# Patient Record
Sex: Male | Born: 1960 | Race: White | Hispanic: No | Marital: Single | Smoking: Current every day smoker
Health system: Southern US, Community
[De-identification: ages and names within clinical notes are randomized; demographics above are authoritative.]

## PROBLEM LIST (undated history)

## (undated) DIAGNOSIS — M329 Systemic lupus erythematosus, unspecified: Secondary | ICD-10-CM

## (undated) DIAGNOSIS — E782 Mixed hyperlipidemia: Secondary | ICD-10-CM

## (undated) DIAGNOSIS — R9439 Abnormal result of other cardiovascular function study: Secondary | ICD-10-CM

## (undated) DIAGNOSIS — M1990 Unspecified osteoarthritis, unspecified site: Secondary | ICD-10-CM

## (undated) DIAGNOSIS — I219 Acute myocardial infarction, unspecified: Secondary | ICD-10-CM

## (undated) DIAGNOSIS — M4800 Spinal stenosis, site unspecified: Secondary | ICD-10-CM

## (undated) HISTORY — PX: ABDOMINAL SURGERY: SHX537

## (undated) HISTORY — PX: HERNIA REPAIR: SHX51

## (undated) HISTORY — PX: SPINAL CORD STIMULATOR IMPLANT: SHX2422

## (undated) HISTORY — DX: Unspecified osteoarthritis, unspecified site: M19.90

## (undated) HISTORY — DX: Mixed hyperlipidemia: E78.2

## (undated) HISTORY — DX: Abnormal result of other cardiovascular function study: R94.39

## (undated) HISTORY — DX: Acute myocardial infarction, unspecified: I21.9

## (undated) HISTORY — DX: Systemic lupus erythematosus, unspecified: M32.9

## (undated) HISTORY — PX: SHOULDER SURGERY: SHX246

## (undated) HISTORY — DX: Spinal stenosis, site unspecified: M48.00

## (undated) HISTORY — PX: WRIST SURGERY: SHX841

## (undated) HISTORY — PX: BACK SURGERY: SHX140

---

## 2015-01-17 ENCOUNTER — Ambulatory Visit (INDEPENDENT_AMBULATORY_CARE_PROVIDER_SITE_OTHER): Payer: Managed Care, Other (non HMO)

## 2015-01-17 ENCOUNTER — Encounter: Payer: Self-pay | Admitting: *Deleted

## 2015-01-17 VITALS — BP 163/78 | HR 69 | Temp 97.7°F | Resp 16

## 2015-01-17 DIAGNOSIS — Z862 Personal history of diseases of the blood and blood-forming organs and certain disorders involving the immune mechanism: Secondary | ICD-10-CM

## 2015-01-17 DIAGNOSIS — M722 Plantar fascial fibromatosis: Secondary | ICD-10-CM

## 2015-01-17 DIAGNOSIS — L949 Localized connective tissue disorder, unspecified: Secondary | ICD-10-CM

## 2015-01-17 DIAGNOSIS — R52 Pain, unspecified: Secondary | ICD-10-CM

## 2015-01-17 DIAGNOSIS — R609 Edema, unspecified: Secondary | ICD-10-CM

## 2015-01-17 DIAGNOSIS — Z872 Personal history of diseases of the skin and subcutaneous tissue: Secondary | ICD-10-CM

## 2015-01-17 DIAGNOSIS — M7751 Other enthesopathy of right foot: Secondary | ICD-10-CM

## 2015-01-17 DIAGNOSIS — S93401A Sprain of unspecified ligament of right ankle, initial encounter: Secondary | ICD-10-CM

## 2015-01-17 DIAGNOSIS — M79671 Pain in right foot: Secondary | ICD-10-CM

## 2015-01-17 MED ORDER — PREDNISONE 10 MG PO KIT: PACK | ORAL | Status: DC

## 2015-01-17 NOTE — Patient Instructions (Signed)

## 2015-01-17 NOTE — Progress Notes (Signed)
   Subjective:    Patient ID: Charles Nielsen, male    DOB: 08/24/1961, 54 y.o.   MRN: [id number]  HPI I HURT MY FOOT ABOUT 2 MONTHS AGO AND SHOVED IT UP UNDER A COUNTER AND BURNS AND THROBS AND GET NUMB AND TINGLES AND FELL TWO WEEKS BEFORE I HURT MY FOOT AND HURT MY RIGHT HIP AND SWELLING HAS GONE DOWN AND BRUISED    Review of Systems  Skin: Positive for rash.  All other systems reviewed and are negative.      Objective:   Physical Exam 54 year old white male well-developed well-nourished oriented 3 presents this time with significant pain distress affecting his right foot and ankle. Has pain ball the foot and toes with some paresthesias pins Neils lecture shock and shooting sensation is also pain tenderness on palpation with a nodular lesion mid band plantar fascial on the right foot. Having pain along the medial malleoli are and medial ankle area is well on the right foot. Does have history of injury at least 2 occasions most recently couple days ago however continues to have subsequent swelling paresthesia tingling and abnormal sensation of the forefoot rear foot and digits. Basket her status appears to be intact with pedal pulses palpable DP +2 PT plus one over 4 bilateral Refill time 3 seconds all digits. Epicritic and proprioceptive sensations intact and symmetric bilateral there is normal plantar response DTRs not listed. Dermatologically skin color pigment normal hair growth absent distally there is no open wounds no ulcers no secondary infection there is again nodularity of the medial band plantar fascia on the right there is tenderness and dorsal flexion plantar flexion a palpation around the MTP joints which otherwise are rectus x-rays reveal no fracture no cyst or tumors no signs of osseous abnormality there is pain on palpation of medial malleoli are area left foot and ankle lateral malleolus is asymptomatic pain tenderness both on dorsiflexion plantar flexion inversion of the ankle as  well as inversion eversion of the midfoot and palpation of the plantar fascia was no ulcers no secondary infections patient does have a history of systemic lupus treated previously with plaque Wynell certainly contribute to a slow healing or slow recovery from a injury likely chronic soft tissue or chronic connective tissue disorder cannot be ruled out this time.       Assessment & Plan:  Assessment right foot and ankle injury likely ankle sprain medial malleoli or sprain as well as plantar fascial tear or pull or plantar fasciitis of the right foot. Plan at this time patient is placed in air fracture walker for at least of to 4 week duration 2 weeks minimum at least 4 weeks preferably. Also this time Sterapred dose pack is initiated 12 days as instructed recommend ice to the area elevate and offload the foot. Continue monitor recheck possibly recheck x-ray in 2-4 weeks recommend 4 week follow-up at this time. Prescription for Sterapred Deas pack is given at this time 12 days maintain air fracture boot immobilization and elevation as instructed  Alvan Dame DPM

## 2015-02-14 ENCOUNTER — Ambulatory Visit (INDEPENDENT_AMBULATORY_CARE_PROVIDER_SITE_OTHER): Payer: Managed Care, Other (non HMO)

## 2015-02-14 ENCOUNTER — Encounter: Payer: Self-pay | Admitting: *Deleted

## 2015-02-14 VITALS — BP 112/63 | HR 62 | Resp 18

## 2015-02-14 DIAGNOSIS — S93401A Sprain of unspecified ligament of right ankle, initial encounter: Secondary | ICD-10-CM

## 2015-02-14 DIAGNOSIS — M722 Plantar fascial fibromatosis: Secondary | ICD-10-CM | POA: Diagnosis not present

## 2015-02-14 DIAGNOSIS — M7751 Other enthesopathy of right foot: Secondary | ICD-10-CM | POA: Diagnosis not present

## 2015-02-14 DIAGNOSIS — S93401S Sprain of unspecified ligament of right ankle, sequela: Secondary | ICD-10-CM

## 2015-02-14 DIAGNOSIS — M79671 Pain in right foot: Secondary | ICD-10-CM | POA: Diagnosis not present

## 2015-02-14 NOTE — Patient Instructions (Signed)
ICE INSTRUCTIONS  Apply ice or cold pack to the affected area at least 3 times a day for 10-15 minutes each time.  You should also use ice after prolonged activity or vigorous exercise.  Do not apply ice longer than 20 minutes at one time.  Always keep a cloth between your skin and the ice pack to prevent burns.  Being consistent and following these instructions will help control your symptoms.  We suggest you purchase a gel ice pack because they are reusable and do bit leak.  Some of them are designed to wrap around the area.  Use the method that works best for you.  Here are some other suggestions for icing.   Use a frozen bag of peas or corn-inexpensive and molds well to your body, usually stays frozen for 10 to 20 minutes.  Wet a towel with cold water and squeeze out the excess until it's damp.  Place in a bag in the freezer for 20 minutes. Then remove and use.   Recommend using alternating warm warm collar hot compress and ice pack 10 minutes of each repeat 2 or 3 times every evening. Maintain ankle stabilizer for the next 2 or 3 months to help the ankle heel and strengthening.

## 2015-02-14 NOTE — Progress Notes (Signed)
   Subjective:    Patient ID: Charles Nielsen, male    DOB: 1961/11/05, 54 y.o.   MRN: [id number]  HPI IT IS STILL SORE ON BOTH SIDE OF MY RIGHT FOOT    Review of Systems no new findings or changes     Objective:   Physical Exam Neurovascular status is intact and unchanged pedal pulses are palpable epicritic and proprioceptive sensations continues to have pain medial malleoli are area of the right ankle in likely ankle some mild instability the ankle. Medial ankle sprain did respond for the last 4 weeks with the air fracture boot for mobilization however still some tenderness on palpation inferior to the malleoli area along the deltoid ligament insertion. There is also some tenderness of the posterior tibial tendon area. 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 At this time neurovascular status intact and unchanged pedal pulses palpable does have a history of multiple injuries rotator cuff left arm surgery and ligament and tendon injuries affecting both arms.       Assessment & Plan:  Assessment resolving are healing medial ankle sprain with some residual capsulitis and posterior tibial tendinitis of the medial ankle. Patient may discontinue boot and return to a ankle stabilizer which is less obtrusive should allow to return to work activities at least part-time for the next couple weeks and then eventually full-time. Maintain ankle stabilizer maintain NSAID warm compress and ice pack as instructed recheck in a month or 2 if not resolved completely may be candidate for other noninvasive study MRI or invasive treatment such as injection.  Alvan Dame DPM

## 2015-03-14 ENCOUNTER — Ambulatory Visit: Payer: Managed Care, Other (non HMO)

## 2017-11-04 DIAGNOSIS — R079 Chest pain, unspecified: Secondary | ICD-10-CM

## 2017-11-05 DIAGNOSIS — R079 Chest pain, unspecified: Secondary | ICD-10-CM | POA: Diagnosis not present

## 2017-11-15 ENCOUNTER — Ambulatory Visit (HOSPITAL_BASED_OUTPATIENT_CLINIC_OR_DEPARTMENT_OTHER)
Admission: RE | Admit: 2017-11-15 | Discharge: 2017-11-15 | Disposition: A | Discharge status: Home / Self Care | Payer: 59 | Source: Ambulatory Visit | Attending: Cardiology | Admitting: Cardiology

## 2017-11-15 ENCOUNTER — Encounter: Payer: Self-pay | Admitting: Cardiology

## 2017-11-15 ENCOUNTER — Ambulatory Visit: Payer: 59 | Admitting: Cardiology

## 2017-11-15 DIAGNOSIS — E785 Hyperlipidemia, unspecified: Secondary | ICD-10-CM

## 2017-11-15 DIAGNOSIS — L930 Discoid lupus erythematosus: Secondary | ICD-10-CM | POA: Insufficient documentation

## 2017-11-15 DIAGNOSIS — F17200 Nicotine dependence, unspecified, uncomplicated: Secondary | ICD-10-CM | POA: Insufficient documentation

## 2017-11-15 DIAGNOSIS — I209 Angina pectoris, unspecified: Secondary | ICD-10-CM

## 2017-11-15 DIAGNOSIS — IMO0001 Reserved for inherently not codable concepts without codable children: Secondary | ICD-10-CM

## 2017-11-15 HISTORY — DX: Angina pectoris, unspecified: I20.9

## 2017-11-15 HISTORY — DX: Reserved for inherently not codable concepts without codable children: IMO0001

## 2017-11-15 HISTORY — DX: Discoid lupus erythematosus: L93.0

## 2017-11-15 HISTORY — DX: Nicotine dependence, unspecified, uncomplicated: F17.200

## 2017-11-15 HISTORY — DX: Hyperlipidemia, unspecified: E78.5

## 2017-11-15 MED ORDER — ASPIRIN EC 81 MG PO TBEC: 81.0000 mg | DELAYED_RELEASE_TABLET | Freq: Every day | ORAL | 3 refills | Status: DC

## 2017-11-15 MED ORDER — NITROGLYCERIN 0.4 MG SL SUBL: 0.4000 mg | SUBLINGUAL_TABLET | SUBLINGUAL | 6 refills | Status: DC | PRN

## 2017-11-15 MED ORDER — RANOLAZINE ER 500 MG PO TB12: 500.0000 mg | ORAL_TABLET | Freq: Two times a day (BID) | ORAL | Status: DC

## 2017-11-15 MED ORDER — RANOLAZINE ER 500 MG PO TB12
500.0000 mg | ORAL_TABLET | Freq: Two times a day (BID) | ORAL | 0 refills | Status: DC
Start: 2017-11-15 — End: 2021-06-24
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
## 2017-11-15 NOTE — Patient Instructions (Addendum)
Medication Instructions:  Your physician has recommended you make the following change in your medication: START 500 mg ranexa twice a day START 81 mg of enteric coated aspirin  Labwork: Your physician recommends that you have the following labs drawn: CBC, BMP, and PT/INR  Testing/Procedures:   [facility] CARDIOVASCULAR DIVISION [facility] [place] [address] [place] [address] Dept: [phone] Loc: [phone]  Anden Brouillette  11/15/2017  You are scheduled for a Cardiac Catheterization on Thursday, December 20 with Dr. Lance Muss.  1. Please arrive at the [facility] (Main Entrance A) at [facility]: [address] [place], [place] [address] at 5:30 AM (two hours before your procedure to ensure your preparation). Free valet parking service is available.   Special note: Every effort is made to have your procedure done on time. Please understand that emergencies sometimes delay scheduled procedures.  2. Diet: Do not eat or drink anything after midnight prior to your procedure except sips of water to take medications.  3. Labs: Done today  4. Medication instructions in preparation for your procedure:  On the morning of your procedure, take your Aspirin and any morning medicines NOT listed above.  You may use sips of water.  5. Plan for one night stay--bring personal belongings. 6. Bring a current list of your medications and current insurance cards. 7. You MUST have a responsible person to drive you home. 8. Someone MUST be with you the first 24 hours after you arrive home or your discharge will be delayed. 9. Please wear clothes that are easy to get on and off and wear slip-on shoes.  Thank you for allowing [place] to care for you!   -- [facility]   Follow-Up: Your physician recommends that you schedule a follow-up appointment in: To be determined after the heart  catheterization  Any Other Special Instructions Will Be Listed Below (If Applicable).     If you need a refill on your cardiac medications before your next appointment, please call your pharmacy.   [facility]  Garey Ham, RN, BSN   Chest X-Ray A chest X-ray is a painless test that uses radiation to create images of the structures inside of your chest. Chest X-rays are used to look for many health conditions, including heart failure, pneumonia, tuberculosis, rib fractures, breathing disorders, and cancer. They may be used to diagnose chest pain, constant coughing, or trouble breathing. Tell a health care provider about:  Any allergies you have.  All medicines you are taking, including vitamins, herbs, eye drops, creams, and over-the-counter medicines.  Any surgeries you have had.  Any medical conditions you have.  Whether you are pregnant or may be pregnant. What are the risks? Getting a chest X-ray is a safe procedure. However, you will be exposed to a small amount of radiation. Being exposed to too much radiation over a lifetime can increase the risk of cancer. This risk is small, but it may occur if you have many X-rays throughout your life. What happens before the procedure?  You may be asked to remove glasses, jewelry, and any other metal objects.  You will be asked to undress from the waist up. You may be given a hospital gown to wear.  You may be asked to wear a protective lead apron to protect parts of your body from radiation. What happens during the procedure?  You will be asked to stand still as each picture  is taken to get the best possible images.  You will be asked to take a deep breath and hold your breath for a few seconds.  The X-ray machine will create a picture of your chest using a tiny burst of radiation. This is painless.  More pictures may be taken from other angles. Typically, one picture will be taken while you face the X-ray camera, and  another picture will be taken from the side while you stand. If you cannot stand, you may be asked to lie down. The procedure may vary among health care providers and hospitals. What happens after the procedure?  The X-ray(s) will be reviewed by your health care provider or an X-ray (radiology) specialist.  It is up to you to get your test results. Ask your health care provider, or the department that is doing the test, when your results will be ready.  Your health care provider will tell you if you need more tests or a follow-up exam. Keep all follow-up visits as told by your health care provider. This is important. Summary  A chest X-ray is a safe, painless test that is used to examine the inside of the chest, heart, and lungs.  You will need to undress from the waist up and remove jewelry and metal objects before the procedure.  You will be exposed to a small amount of radiation during the procedure.  The X-ray machine will take one or more pictures of your chest while you remain as still as possible.  Later, a health care provider or specialist will review the test results with you. This information is not intended to replace advice given to you by your health care provider. Make sure you discuss any questions you have with your health care provider. Document Released: 01/11/2017 Document Revised: 01/11/2017 Document Reviewed: 01/11/2017 Elsevier Interactive Patient Education  2018 [employer].  Coronary Angiogram With Stent Coronary angiogram with stent placement is a procedure to widen or open a narrow blood vessel of the heart (coronary artery). Arteries may become blocked by cholesterol buildup (plaques) in the lining or wall. When a coronary artery becomes partially blocked, blood flow to that area decreases. This may lead to chest pain or a heart attack (myocardial infarction). A stent is a small piece of metal that looks like mesh or a spring. Stent placement may be done as  treatment for a heart attack or right after a coronary angiogram in which a blocked artery is found. Let your health care provider know about:  Any allergies you have.  All medicines you are taking, including vitamins, herbs, eye drops, creams, and over-the-counter medicines.  Any problems you or family members have had with anesthetic medicines.  Any blood disorders you have.  Any surgeries you have had.  Any medical conditions you have.  Whether you are pregnant or may be pregnant. What are the risks? Generally, this is a safe procedure. However, problems may occur, including:  Damage to the heart or its blood vessels.  A return of blockage.  Bleeding, infection, or bruising at the insertion site.  A collection of blood under the skin (hematoma) at the insertion site.  A blood clot in another part of the body.  Kidney injury.  Allergic reaction to the dye or contrast that is used.  Bleeding into the abdomen (retroperitoneal bleeding).  What happens before the procedure? Staying hydrated Follow instructions from your health care provider about hydration, which may include:  Up to 2 hours before the procedure -  you may continue to drink clear liquids, such as water, clear fruit juice, black coffee, and plain tea.  Eating and drinking restrictions Follow instructions from your health care provider about eating and drinking, which may include:  8 hours before the procedure - stop eating heavy meals or foods such as meat, fried foods, or fatty foods.  6 hours before the procedure - stop eating light meals or foods, such as toast or cereal.  2 hours before the procedure - stop drinking clear liquids.  Ask your health care provider about:  Changing or stopping your regular medicines. This is especially important if you are taking diabetes medicines or blood thinners.  Taking medicines such as ibuprofen. These medicines can thin your blood. Do not take these medicines  before your procedure if your health care provider instructs you not to. Generally, aspirin is recommended before a procedure of passing a small, thin tube (catheter) through a blood vessel and into the heart (cardiac catheterization).  What happens during the procedure?  An IV tube will be inserted into one of your veins.  You will be given one or more of the following: ? A medicine to help you relax (sedative). ? A medicine to numb the area where the catheter will be inserted into an artery (local anesthetic).  To reduce your risk of infection: ? Your health care team will wash or sanitize their hands. ? Your skin will be washed with soap. ? Hair may be removed from the area where the catheter will be inserted.  Using a guide wire, the catheter will be inserted into an artery. The location may be in your groin, in your wrist, or in the fold of your arm (near your elbow).  A type of X-ray (fluoroscopy) will be used to help guide the catheter to the opening of the arteries in the heart.  A dye will be injected into the catheter, and X-rays will be taken. The dye will help to show where any narrowing or blockages are located in the arteries.  A tiny wire will be guided to the blocked spot, and a balloon will be inflated to make the artery wider.  The stent will be expanded and will crush the plaques into the wall of the vessel. The stent will hold the area open and improve the blood flow. Most stents have a drug coating to reduce the risk of the stent narrowing over time.  The artery may be made wider using a drill, laser, or other tools to remove plaques.  When the blood flow is better, the catheter will be removed. The lining of the artery will grow over the stent, which stays where it was placed. This procedure may vary among health care providers and hospitals. What happens after the procedure?  If the procedure is done through the leg, you will be kept in bed lying flat for about 6  hours. You will be instructed to not bend and not cross your legs.  The insertion site will be checked frequently.  The pulse in your foot or wrist will be checked frequently.  You may have additional blood tests, X-rays, and a test that records the electrical activity of your heart (electrocardiogram, or ECG). This information is not intended to replace advice given to you by your health care provider. Make sure you discuss any questions you have with your health care provider. Document Released: 05/22/2003 Document Revised: 07/15/2016 Document Reviewed: 06/20/2016 Elsevier Interactive Patient Education  2017 [employer].  Smoking Tobacco Information Smoking tobacco will very likely harm your health. Tobacco contains a poisonous (toxic), colorless chemical called nicotine. Nicotine affects the brain and makes tobacco addictive. This change in your brain can make it hard to stop smoking. Tobacco also has other toxic chemicals that can hurt your body and raise your risk of many cancers. How can smoking tobacco affect me? Smoking tobacco can increase your chances of having serious health conditions, such as:  Cancer. Smoking is most commonly associated with lung cancer, but can lead to cancer in other parts of the body.  Chronic obstructive pulmonary disease (COPD). This is a long-term lung condition that makes it hard to breathe. It also gets worse over time.  High blood pressure (hypertension), heart disease, stroke, or heart attack.  Lung infections, such as pneumonia.  Cataracts. This is when the lenses in the eyes become clouded.  Digestive problems. This may include peptic ulcers, heartburn, and gastroesophageal reflux disease (GERD).  Oral health problems, such as gum disease and tooth loss.  Loss of taste and smell.  Smoking can affect your appearance by causing:  Wrinkles.  Yellow or stained teeth, fingers, and fingernails.  Smoking tobacco can also affect your social  life.  Many workplaces, [employer], hotels, and public places are tobacco-free. This means that you may experience challenges in finding places to smoke when away from home.  The cost of a smoking habit can be expensive. Expenses for someone who smokes come in two ways: ? You spend money on a regular basis to buy tobacco. ? Your health care costs in the long-term are higher if you smoke.  Tobacco smoke can also affect the health of those around you. Children of smokers have greater chances of: ? Sudden infant death syndrome (SIDS). ? Ear infections. ? Lung infections.  What lifestyle changes can be made?  Do not start smoking. Quit if you already do.  To quit smoking: ? Make a plan to quit smoking and commit yourself to it. Look for programs to help you and ask your health care provider for recommendations and ideas. ? Talk with your health care provider about using nicotine replacement medicines to help you quit. Medicine replacement medicines include gum, lozenges, patches, sprays, or pills. ? Do not replace cigarette smoking with electronic cigarettes, which are commonly called e-cigarettes. The safety of e-cigarettes is not known, and some may contain harmful chemicals. ? Avoid places, people, or situations that tempt you to smoke. ? If you try to quit but return to smoking, don't give up hope. It is very common for people to try a number of times before they fully succeed. When you feel ready again, give it another try.  Quitting smoking might affect the way you eat as well as your weight. Be prepared to monitor your eating habits. Get support in planning and following a healthy diet.  Ask your health care provider about having regular tests (screenings) to check for cancer. This may include blood tests, imaging tests, and other tests.  Exercise regularly. Consider taking walks, joining a gym, or doing yoga or exercise classes.  Develop skills to manage your stress. These skills  include meditation. What are the benefits of quitting smoking? By quitting smoking, you may:  Lower your risk of getting cancer and other diseases caused by smoking.  Live longer.  Breathe better.  Lower your blood pressure and heart rate.  Stop your addiction to tobacco.  Stop creating secondhand smoke that hurts other people.  Improve  your sense of taste and smell.  Look better over time, due to having fewer wrinkles and less staining.  What can happen if changes are not made? If you do not stop smoking, you may:  Get cancer and other diseases.  Develop COPD or other long-term (chronic) lung conditions.  Develop serious problems with your heart and blood vessels (cardiovascular system).  Need more tests to screen for problems caused by smoking.  Have higher, long-term healthcare costs from medicines or treatments related to smoking.  Continue to have worsening changes in your lungs, mouth, and nose.  Where to find support: To get support to quit smoking, consider:  Asking your health care provider for more information and resources.  Taking classes to learn more about quitting smoking.  Looking for local organizations that offer resources about quitting smoking.  Joining a support group for people who want to quit smoking in your local community.  Where to find more information: You may find more information about quitting smoking from:  HelpGuide.org: www.helpguide.org/articles/addictions/how-to-quit-smoking.htm  [employer]: smokefree.gov  American Lung Association: www.lung.org  Contact a health care provider if:  You have problems breathing.  Your lips, nose, or fingers turn blue.  You have chest pain.  You are coughing up blood.  You feel faint or you pass out.  You have other noticeable changes that cause you to worry. Summary  Smoking tobacco can negatively affect your health, the health of those around you, your finances, and your  social life.  Do not start smoking. Quit if you already do. If you need help quitting, ask your health care provider.  Think about joining a support group for people who want to quit smoking in your local community. There are many effective programs that will help you to quit this behavior. This information is not intended to replace advice given to you by your health care provider. Make sure you discuss any questions you have with your health care provider. Document Released: 11/30/2016 Document Revised: 11/30/2016 Document Reviewed: 11/30/2016 Elsevier Interactive Patient Education  2018 [employer]. Ranolazine tablets, extended release What is this medicine? RANOLAZINE (ra NOE la zeen) is a heart medicine. It is used to treat chronic chest pain (angina). This medicine must be taken regularly. It will not relieve an acute episode of chest pain. This medicine may be used for other purposes; ask your health care provider or pharmacist if you have questions. COMMON BRAND NAME(S): Ranexa What should I tell my health care provider before I take this medicine? They need to know if you have any of these conditions: -heart disease -irregular heartbeat -kidney disease -liver disease -low levels of potassium or magnesium in the blood -an unusual or allergic reaction to ranolazine, other medicines, foods, dyes, or preservatives -pregnant or trying to get pregnant -breast-feeding How should I use this medicine? Take this medicine by mouth with a glass of water. Follow the directions on the prescription label. Do not cut, crush, or chew this medicine. Take with or without food. Do not take this medication with grapefruit juice. Take your doses at regular intervals. Do not take your medicine more often then directed. Talk to your pediatrician regarding the use of this medicine in children. Special care may be needed. Overdosage: If you think you have taken too much of this medicine contact a poison  control center or emergency room at once. NOTE: This medicine is only for you. Do not share this medicine with others. What if I miss a dose? If you  miss a dose, take it as soon as you can. If it is almost time for your next dose, take only that dose. Do not take double or extra doses. What may interact with this medicine? Do not take this medicine with any of the following medications: -antivirals for HIV or AIDS -cerivastatin -certain antibiotics like chloramphenicol, clarithromycin, dalfopristin; quinupristin, isoniazid, rifabutin, rifampin, rifapentine -certain medicines used for cancer like imatinib, nilotinib -certain medicines for fungal infections like fluconazole, itraconazole, ketoconazole, posaconazole, voriconazole -certain medicines for irregular heart beat like dofetilide, dronedarone -certain medicines for seizures like carbamazepine, fosphenytoin, oxcarbazepine, phenobarbital, phenytoin -cisapride -conivaptan -cyclosporine -grapefruit or grapefruit juice -lumacaftor; ivacaftor -nefazodone -pimozide -quinacrine -St John's wort -thioridazine -ziprasidone This medicine may also interact with the following medications: -alfuzosin -certain medicines for depression, anxiety, or psychotic disturbances like bupropion, citalopram, fluoxetine, fluphenazine, paroxetine, perphenazine, risperidone, sertraline, trifluoperazine -certain medicines for cholesterol like atorvastatin, lovastatin, simvastatin -certain medicines for stomach problems like octreotide, palonosetron, prochlorperazine -eplerenone -ergot alkaloids like dihydroergotamine, ergonovine, ergotamine, methylergonovine -metformin -nicardipine -other medicines that prolong the QT interval (cause an abnormal heart rhythm) -sirolimus -tacrolimus This list may not describe all possible interactions. Give your health care provider a list of all the medicines, herbs, non-prescription drugs, or dietary supplements you use.  Also tell them if you smoke, drink alcohol, or use illegal drugs. Some items may interact with your medicine. What should I watch for while using this medicine? Visit your doctor for regular check ups. Tell your doctor or healthcare professional if your symptoms do not start to get better or if they get worse. This medicine will not relieve an acute attack of angina or chest pain. This medicine can change your heart rhythm. Your health care provider may check your heart rhythm by ordering an electrocardiogram (ECG) while you are taking this medicine. You may get drowsy or dizzy. Do not drive, use machinery, or do anything that needs mental alertness until you know how this medicine affects you. Do not stand or sit up quickly, especially if you are an older patient. This reduces the risk of dizzy or fainting spells. Alcohol may interfere with the effect of this medicine. Avoid alcoholic drinks. If you are scheduled for any medical or dental procedure, tell your healthcare provider that you are taking this medicine. This medicine can interact with other medicines used during surgery. What side effects may I notice from receiving this medicine? Side effects that you should report to your doctor or health care professional as soon as possible: -allergic reactions like skin rash, itching or hives, swelling of the face, lips, or tongue -breathing problems -changes in vision -fast, irregular or pounding heartbeat -feeling faint or lightheaded, falls -low or high blood pressure -numbness or tingling feelings -ringing in the ears -tremor or shakiness -slow heartbeat (fewer than 50 beats per minute) -swelling of the legs or feet Side effects that usually do not require medical attention (report to your doctor or health care professional if they continue or are bothersome): -constipation -drowsy -dry mouth -headache -nausea or vomiting -stomach upset This list may not describe all possible side  effects. Call your doctor for medical advice about side effects. You may report side effects to FDA at 1-800-FDA-1088. Where should I keep my medicine? Keep out of the reach of children. Store at room temperature between 15 and 30 degrees C (59 and 86 degrees F). Throw away any unused medicine after the expiration date. NOTE: This sheet is a summary. It may not cover all possible information.  If you have questions about this medicine, talk to your doctor, pharmacist, or health care provider.  2018 Elsevier/Gold Standard (2015-12-18 12:24:15)

## 2017-11-16 ENCOUNTER — Telehealth: Payer: Self-pay

## 2017-11-16 LAB — CBC WITH DIFFERENTIAL/PLATELET
BASOS: 0 %
Basophils Absolute: 0 10*3/uL (ref 0.0–0.2)
EOS (ABSOLUTE): 0.3 10*3/uL (ref 0.0–0.4)
Eos: 3 %
HEMOGLOBIN: 14.8 g/dL (ref 13.0–17.7)
Hematocrit: 42.6 % (ref 37.5–51.0)
IMMATURE GRANS (ABS): 0 10*3/uL (ref 0.0–0.1)
Immature Granulocytes: 1 %
LYMPHS: 25 %
Lymphocytes Absolute: 2.1 10*3/uL (ref 0.7–3.1)
MCH: 31 pg (ref 26.6–33.0)
MCHC: 34.7 g/dL (ref 31.5–35.7)
MCV: 89 fL (ref 79–97)
Monocytes Absolute: 0.6 10*3/uL (ref 0.1–0.9)
Monocytes: 7 %
NEUTROS ABS: 5.5 10*3/uL (ref 1.4–7.0)
Neutrophils: 64 %
PLATELETS: 243 10*3/uL (ref 150–379)
RBC: 4.77 x10E6/uL (ref 4.14–5.80)
RDW: 13.8 % (ref 12.3–15.4)
WBC: 8.5 10*3/uL (ref 3.4–10.8)

## 2017-11-16 LAB — BASIC METABOLIC PANEL
BUN / CREAT RATIO: 16 (ref 9–20)
BUN: 12 mg/dL (ref 6–24)
CO2: 24 mmol/L (ref 20–29)
CREATININE: 0.77 mg/dL (ref 0.76–1.27)
Calcium: 9.6 mg/dL (ref 8.7–10.2)
Chloride: 101 mmol/L (ref 96–106)
GFR, EST AFRICAN AMERICAN: 116 mL/min/{1.73_m2} (ref >59)
GFR, EST NON AFRICAN AMERICAN: 100 mL/min/{1.73_m2} (ref >59)
Glucose: 89 mg/dL (ref 65–99)
Potassium: 4.5 mmol/L (ref 3.5–5.2)
Sodium: 138 mmol/L (ref 134–144)

## 2017-11-16 LAB — PROTIME-INR
INR: 1 (ref 0.8–1.2)
Prothrombin Time: 10.2 s (ref 9.1–12.0)

## 2017-11-16 NOTE — Telephone Encounter (Signed)
Patient contacted pre-catheterization at [facility] scheduled for:  11/17/2017 @ 0730 Verified arrival time and place:  [facility] @ 0530 Confirmed AM meds to be taken pre-cath with sip of water: Take ASA Confirmed patient has responsible person to drive home post procedure and observe patient for 24 hours:  yes Addl concerns:  none

## 2017-11-17 ENCOUNTER — Ambulatory Visit (HOSPITAL_COMMUNITY)
Admission: RE | Admit: 2017-11-17 | Discharge: 2017-11-17 | Disposition: A | Discharge status: Home / Self Care | Payer: 59 | Source: Ambulatory Visit | Attending: Interventional Cardiology | Admitting: Interventional Cardiology

## 2017-11-17 ENCOUNTER — Encounter (HOSPITAL_COMMUNITY)
Admission: RE | Disposition: A | Discharge status: Home / Self Care | Payer: Self-pay | Source: Ambulatory Visit | Attending: Interventional Cardiology

## 2017-11-17 ENCOUNTER — Encounter (HOSPITAL_COMMUNITY): Payer: Self-pay | Admitting: Interventional Cardiology

## 2017-11-17 DIAGNOSIS — Z79899 Other long term (current) drug therapy: Secondary | ICD-10-CM | POA: Insufficient documentation

## 2017-11-17 DIAGNOSIS — L930 Discoid lupus erythematosus: Secondary | ICD-10-CM | POA: Insufficient documentation

## 2017-11-17 DIAGNOSIS — R001 Bradycardia, unspecified: Secondary | ICD-10-CM | POA: Diagnosis not present

## 2017-11-17 DIAGNOSIS — R0789 Other chest pain: Secondary | ICD-10-CM | POA: Insufficient documentation

## 2017-11-17 DIAGNOSIS — R9439 Abnormal result of other cardiovascular function study: Secondary | ICD-10-CM

## 2017-11-17 DIAGNOSIS — F17200 Nicotine dependence, unspecified, uncomplicated: Secondary | ICD-10-CM | POA: Insufficient documentation

## 2017-11-17 DIAGNOSIS — E785 Hyperlipidemia, unspecified: Secondary | ICD-10-CM | POA: Diagnosis not present

## 2017-11-17 DIAGNOSIS — I2510 Atherosclerotic heart disease of native coronary artery without angina pectoris: Secondary | ICD-10-CM | POA: Diagnosis not present

## 2017-11-17 HISTORY — PX: LEFT HEART CATH AND CORONARY ANGIOGRAPHY: CATH118249

## 2017-11-17 SURGERY — LEFT HEART CATH AND CORONARY ANGIOGRAPHY
Anesthesia: LOCAL

## 2017-11-17 MED ORDER — SODIUM CHLORIDE 0.9 % IV SOLN: 250.0000 mL | INTRAVENOUS | Status: DC | PRN

## 2017-11-17 MED ORDER — HEPARIN (PORCINE) IN NACL 2-0.9 UNIT/ML-% IJ SOLN
INTRAMUSCULAR | Status: AC
  Filled 2017-11-17: qty 1000

## 2017-11-17 MED ORDER — LIDOCAINE HCL (PF) 1 % IJ SOLN
INTRAMUSCULAR | Status: DC | PRN
  Administered 2017-11-17: 2 mL via SUBCUTANEOUS

## 2017-11-17 MED ORDER — IOPAMIDOL (ISOVUE-370) INJECTION 76%
INTRAVENOUS | Status: DC | PRN
  Administered 2017-11-17: 80 mL via INTRA_ARTERIAL

## 2017-11-17 MED ORDER — SODIUM CHLORIDE 0.9 % IV SOLN: INTRAVENOUS | Status: AC

## 2017-11-17 MED ORDER — SODIUM CHLORIDE 0.9% FLUSH: 3.0000 mL | INTRAVENOUS | Status: DC | PRN

## 2017-11-17 MED ORDER — ASPIRIN 81 MG PO CHEW: 81.0000 mg | CHEWABLE_TABLET | ORAL | Status: DC

## 2017-11-17 MED ORDER — SODIUM CHLORIDE 0.9 % WEIGHT BASED INFUSION: 1.0000 mL/kg/h | INTRAVENOUS | Status: DC

## 2017-11-17 MED ORDER — MIDAZOLAM HCL 2 MG/2ML IJ SOLN
INTRAMUSCULAR | Status: AC
  Filled 2017-11-17: qty 2

## 2017-11-17 MED ORDER — LIDOCAINE HCL (PF) 1 % IJ SOLN
INTRAMUSCULAR | Status: AC
  Filled 2017-11-17: qty 30

## 2017-11-17 MED ORDER — FENTANYL CITRATE (PF) 100 MCG/2ML IJ SOLN
INTRAMUSCULAR | Status: DC | PRN
  Administered 2017-11-17 (×2): 25 ug via INTRAVENOUS

## 2017-11-17 MED ORDER — SODIUM CHLORIDE 0.9% FLUSH: 3.0000 mL | Freq: Two times a day (BID) | INTRAVENOUS | Status: DC

## 2017-11-17 MED ORDER — HEPARIN SODIUM (PORCINE) 1000 UNIT/ML IJ SOLN
INTRAMUSCULAR | Status: DC | PRN
  Administered 2017-11-17: 4500 [IU] via INTRAVENOUS

## 2017-11-17 MED ORDER — SODIUM CHLORIDE 0.9 % WEIGHT BASED INFUSION
3.0000 mL/kg/h | INTRAVENOUS | Status: AC
  Administered 2017-11-17: 3 mL/kg/h via INTRAVENOUS

## 2017-11-17 MED ORDER — HEPARIN SODIUM (PORCINE) 1000 UNIT/ML IJ SOLN
INTRAMUSCULAR | Status: AC
  Filled 2017-11-17: qty 1

## 2017-11-17 MED ORDER — MIDAZOLAM HCL 2 MG/2ML IJ SOLN
INTRAMUSCULAR | Status: DC | PRN
  Administered 2017-11-17: 1 mg via INTRAVENOUS
  Administered 2017-11-17: 2 mg via INTRAVENOUS

## 2017-11-17 MED ORDER — FENTANYL CITRATE (PF) 100 MCG/2ML IJ SOLN
INTRAMUSCULAR | Status: AC
  Filled 2017-11-17: qty 2

## 2017-11-17 MED ORDER — VERAPAMIL HCL 2.5 MG/ML IV SOLN
INTRAVENOUS | Status: DC | PRN
  Administered 2017-11-17: 10 mL via INTRA_ARTERIAL

## 2017-11-17 MED ORDER — IOPAMIDOL (ISOVUE-370) INJECTION 76%
INTRAVENOUS | Status: AC
  Filled 2017-11-17: qty 100

## 2017-11-17 MED ORDER — VERAPAMIL HCL 2.5 MG/ML IV SOLN
INTRAVENOUS | Status: AC
  Filled 2017-11-17: qty 2

## 2017-11-17 MED ORDER — HEPARIN (PORCINE) IN NACL 2-0.9 UNIT/ML-% IJ SOLN
INTRAMUSCULAR | Status: AC | PRN
  Administered 2017-11-17: 1000 mL

## 2017-11-17 SURGICAL SUPPLY — 13 items
CATH INFINITI 5 FR JL3.5 (CATHETERS) ×2 IMPLANT
CATH INFINITI 5 FR MPA2 (CATHETERS) ×2 IMPLANT
CATH INFINITI JR4 5F (CATHETERS) ×2 IMPLANT
COVER PRB 48X5XTLSCP FOLD TPE (BAG) ×1 IMPLANT
COVER PROBE 5X48 (BAG) ×1
DEVICE RAD COMP TR BAND LRG (VASCULAR PRODUCTS) ×2 IMPLANT
GLIDESHEATH SLEND SS 6F .021 (SHEATH) ×2 IMPLANT
GUIDEWIRE INQWIRE 1.5J.035X260 (WIRE) ×1 IMPLANT
INQWIRE 1.5J .035X260CM (WIRE) ×2
KIT HEART LEFT (KITS) ×2 IMPLANT
PACK CARDIAC CATHETERIZATION (CUSTOM PROCEDURE TRAY) ×2 IMPLANT
TRANSDUCER W/STOPCOCK (MISCELLANEOUS) ×2 IMPLANT
TUBING CIL FLEX 10 FLL-RA (TUBING) ×2 IMPLANT

## 2017-11-17 NOTE — Discharge Instructions (Signed)

## 2017-11-17 NOTE — Interval H&P Note (Signed)
Cath Lab Visit (complete for each Cath Lab visit)  Clinical Evaluation Leading to the Procedure:   ACS: No.  Non-ACS:    Anginal Classification: CCS III  Anti-ischemic medical therapy: Minimal Therapy (1 class of medications)  Non-Invasive Test Results: Intermediate-risk stress test findings: cardiac mortality 1-3%/year  Prior CABG: No previous CABG      History and Physical Interval Note:  11/17/2017 7:26 AM  [name]  has presented today for surgery, with the diagnosis of Angina  The various methods of treatment have been discussed with the patient and family. After consideration of risks, benefits and other options for treatment, the patient has consented to  Procedure(s): LEFT HEART CATH AND CORONARY ANGIOGRAPHY (N/A) as a surgical intervention .  The patient's history has been reviewed, patient examined, no change in status, stable for surgery.  I have reviewed the patient's chart and labs.  Questions were answered to the patient's satisfaction.     Lance Muss

## 2017-12-01 ENCOUNTER — Encounter: Payer: Self-pay | Admitting: Cardiology

## 2017-12-01 ENCOUNTER — Ambulatory Visit (INDEPENDENT_AMBULATORY_CARE_PROVIDER_SITE_OTHER): Payer: 59 | Admitting: Cardiology

## 2017-12-01 VITALS — BP 120/70 | HR 72 | Ht 76.0 in | Wt 206.4 lb

## 2017-12-01 DIAGNOSIS — E785 Hyperlipidemia, unspecified: Secondary | ICD-10-CM

## 2017-12-01 DIAGNOSIS — L930 Discoid lupus erythematosus: Secondary | ICD-10-CM | POA: Diagnosis not present

## 2017-12-01 DIAGNOSIS — F17200 Nicotine dependence, unspecified, uncomplicated: Secondary | ICD-10-CM | POA: Diagnosis not present

## 2017-12-01 DIAGNOSIS — I209 Angina pectoris, unspecified: Secondary | ICD-10-CM | POA: Diagnosis not present

## 2017-12-01 NOTE — Patient Instructions (Addendum)
Medication Instructions:  Your physician has recommended you make the following change in your medication:  Stop Metoprolol and Ranexa  Labwork: Your physician recommends that you return for lab work in: 6 weeks for a lipid check at [facility]                      Testing/Procedures: None ordered  Follow-Up: Your physician recommends that you schedule a follow-up appointment in: 2-3 months with Dr. Bing Matter   Any Other Special Instructions Will Be Listed Below (If Applicable).     If you need a refill on your cardiac medications before your next appointment, please call your pharmacy.
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
## 2018-05-20 IMAGING — DX DG CHEST 2V
2 series · 2 of 2 positions shown · non-contrast
Comparison: No prior .

CLINICAL DATA: Cardiac catheterization.

EXAM:
CHEST  2 VIEW

[chest pa]
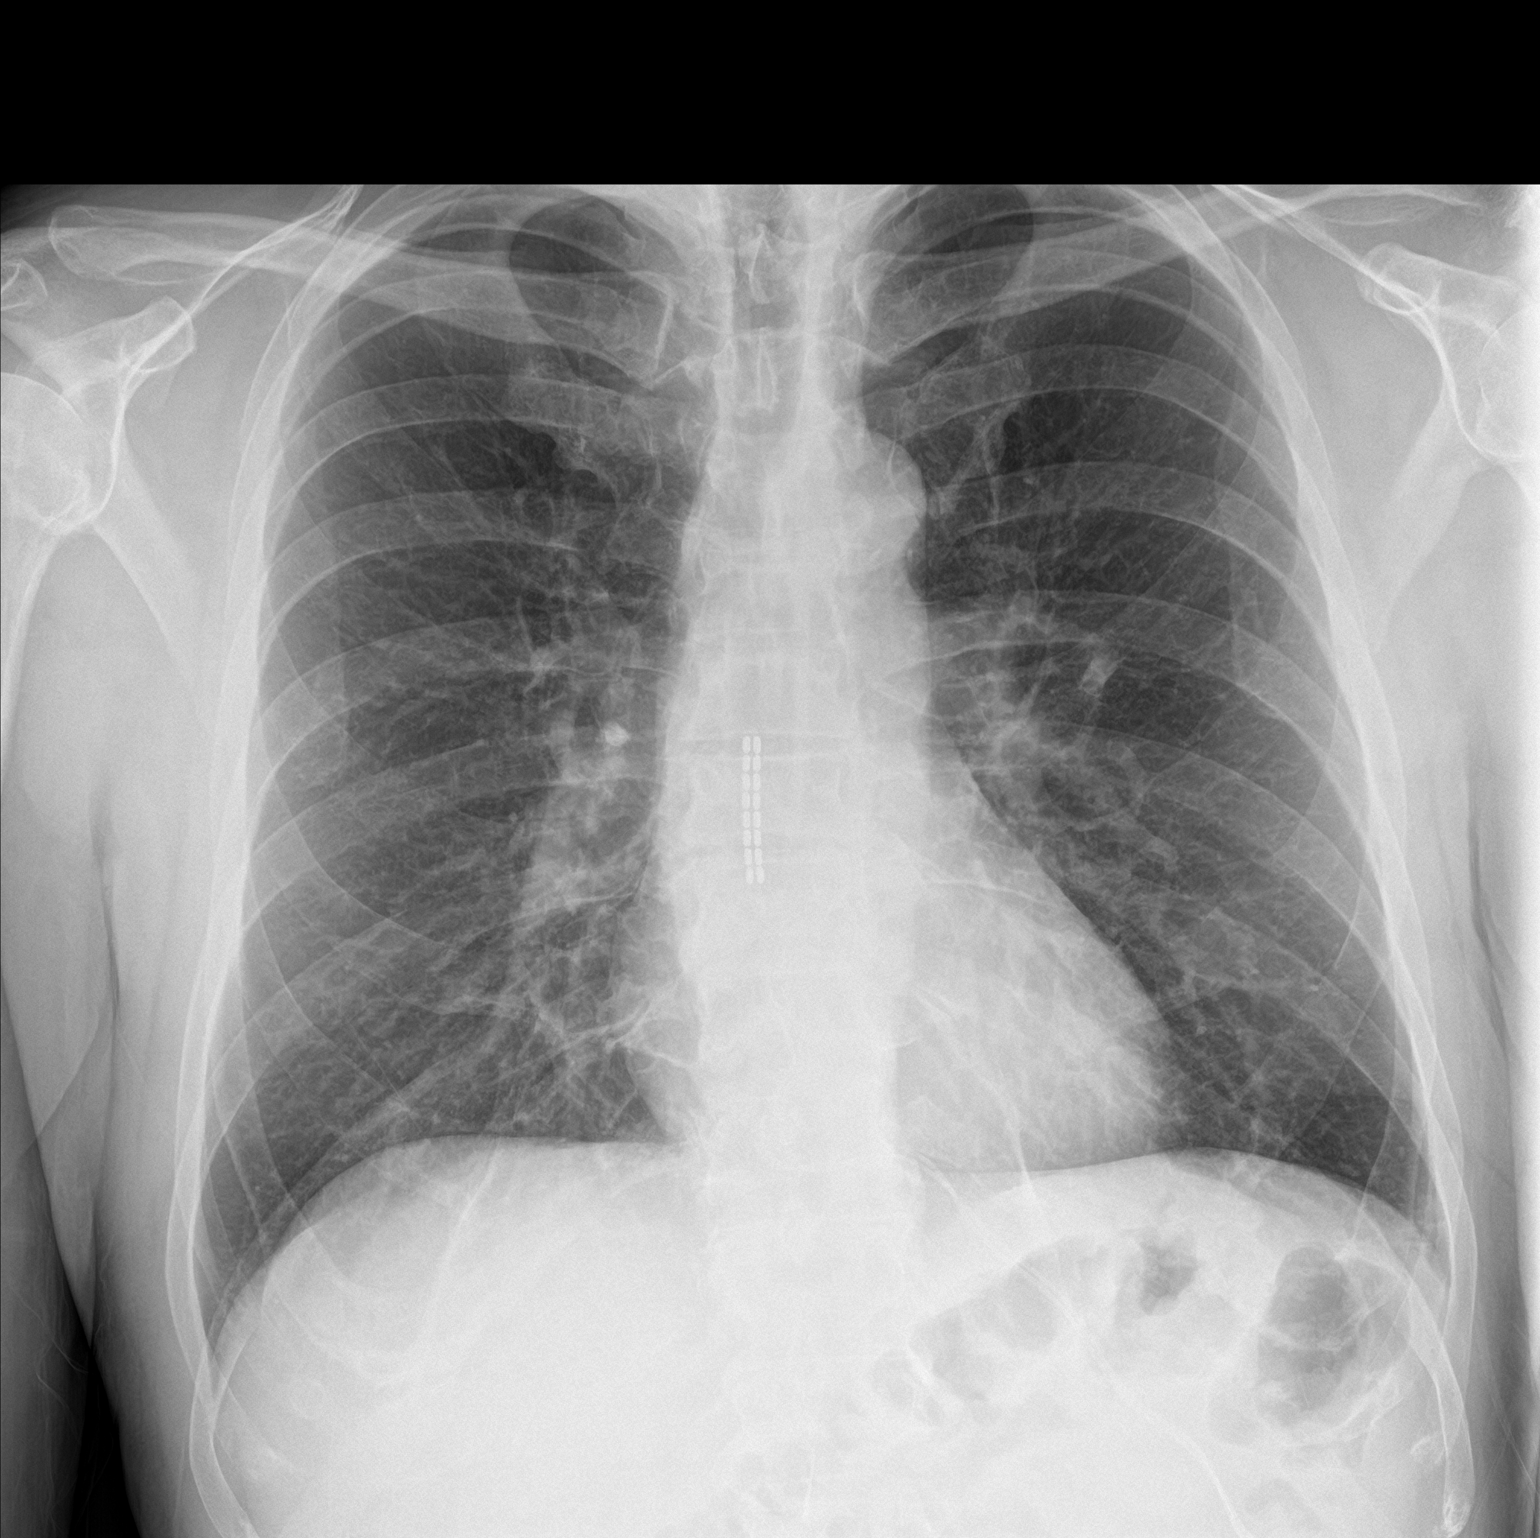

[chest lat]
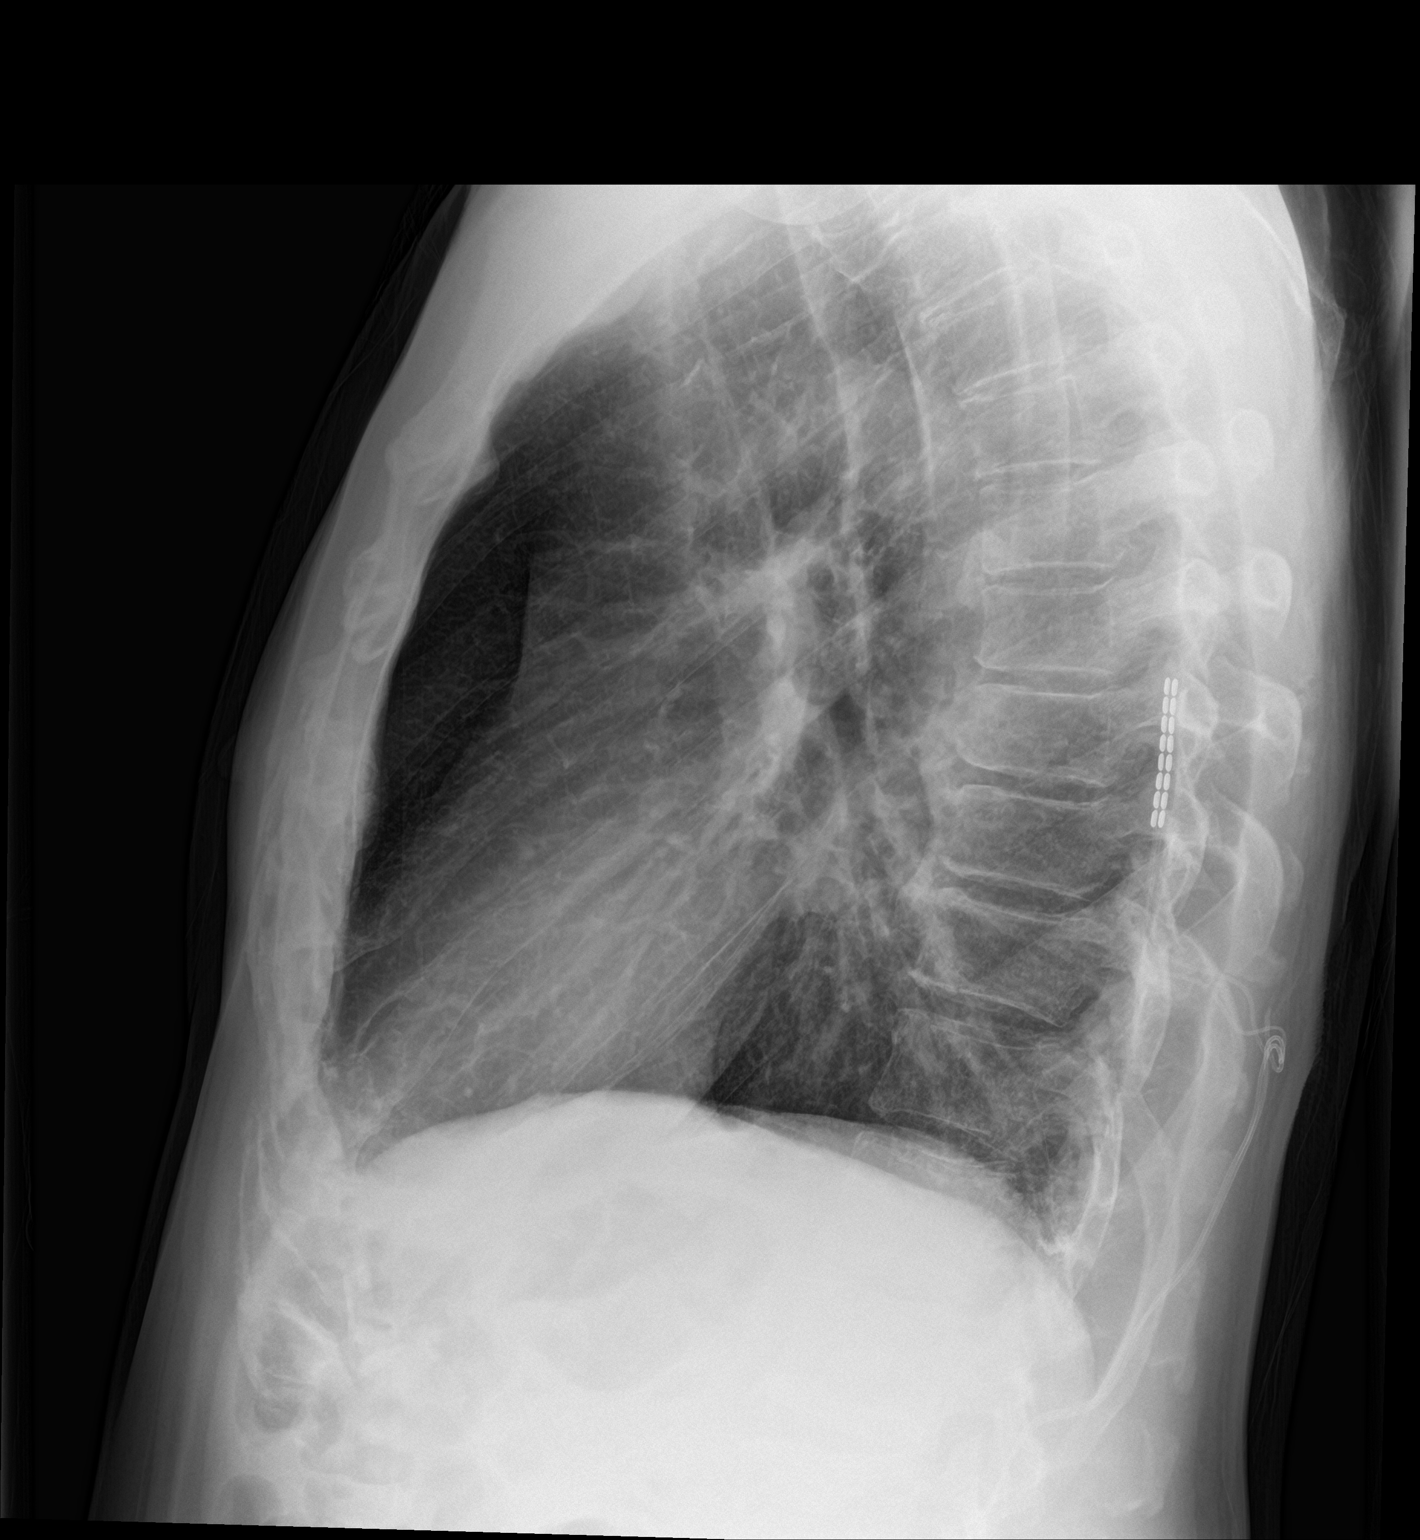

[2 of 2 positions shown; findings below may reference images not displayed]

FINDINGS: Mediastinum and hilar structures are normal. Heart size normal. No
focal infiltrate. No pleural effusion or pneumothorax.
Neurostimulator noted over the midthoracic spine.
IMPRESSION: No acute cardiopulmonary disease.

## 2020-07-17 DIAGNOSIS — H6121 Impacted cerumen, right ear: Secondary | ICD-10-CM | POA: Diagnosis not present

## 2020-07-17 DIAGNOSIS — Z20828 Contact with and (suspected) exposure to other viral communicable diseases: Secondary | ICD-10-CM | POA: Diagnosis not present

## 2020-07-17 DIAGNOSIS — R0981 Nasal congestion: Secondary | ICD-10-CM | POA: Diagnosis not present

## 2020-07-17 DIAGNOSIS — H6122 Impacted cerumen, left ear: Secondary | ICD-10-CM | POA: Diagnosis not present

## 2020-07-17 DIAGNOSIS — R05 Cough: Secondary | ICD-10-CM | POA: Diagnosis not present

## 2020-07-17 DIAGNOSIS — J209 Acute bronchitis, unspecified: Secondary | ICD-10-CM | POA: Diagnosis not present

## 2020-09-17 DIAGNOSIS — Z20828 Contact with and (suspected) exposure to other viral communicable diseases: Secondary | ICD-10-CM | POA: Diagnosis not present

## 2020-09-17 DIAGNOSIS — R051 Acute cough: Secondary | ICD-10-CM | POA: Diagnosis not present

## 2020-09-17 DIAGNOSIS — R5383 Other fatigue: Secondary | ICD-10-CM | POA: Diagnosis not present

## 2021-06-05 DIAGNOSIS — R0789 Other chest pain: Secondary | ICD-10-CM | POA: Diagnosis not present

## 2021-06-08 DIAGNOSIS — R079 Chest pain, unspecified: Secondary | ICD-10-CM | POA: Diagnosis not present

## 2021-06-08 DIAGNOSIS — F411 Generalized anxiety disorder: Secondary | ICD-10-CM | POA: Diagnosis not present

## 2021-06-08 DIAGNOSIS — Z13220 Encounter for screening for lipoid disorders: Secondary | ICD-10-CM | POA: Diagnosis not present

## 2021-06-08 DIAGNOSIS — R002 Palpitations: Secondary | ICD-10-CM | POA: Diagnosis not present

## 2021-06-08 DIAGNOSIS — M797 Fibromyalgia: Secondary | ICD-10-CM | POA: Diagnosis not present

## 2021-06-18 DIAGNOSIS — R002 Palpitations: Secondary | ICD-10-CM | POA: Diagnosis not present

## 2021-06-24 ENCOUNTER — Encounter: Payer: Self-pay | Admitting: *Deleted

## 2021-06-24 ENCOUNTER — Encounter: Payer: Self-pay | Admitting: Cardiology

## 2021-07-06 DIAGNOSIS — I119 Hypertensive heart disease without heart failure: Secondary | ICD-10-CM | POA: Diagnosis not present

## 2021-07-06 DIAGNOSIS — I472 Ventricular tachycardia: Secondary | ICD-10-CM | POA: Diagnosis not present

## 2021-07-06 DIAGNOSIS — R079 Chest pain, unspecified: Secondary | ICD-10-CM | POA: Diagnosis not present

## 2021-07-06 DIAGNOSIS — I252 Old myocardial infarction: Secondary | ICD-10-CM | POA: Diagnosis not present

## 2021-07-09 DIAGNOSIS — R590 Localized enlarged lymph nodes: Secondary | ICD-10-CM | POA: Diagnosis not present

## 2021-07-09 DIAGNOSIS — I6523 Occlusion and stenosis of bilateral carotid arteries: Secondary | ICD-10-CM | POA: Diagnosis not present

## 2021-07-09 DIAGNOSIS — R0989 Other specified symptoms and signs involving the circulatory and respiratory systems: Secondary | ICD-10-CM | POA: Diagnosis not present

## 2021-07-30 DIAGNOSIS — E782 Mixed hyperlipidemia: Secondary | ICD-10-CM | POA: Insufficient documentation

## 2021-07-30 DIAGNOSIS — M1990 Unspecified osteoarthritis, unspecified site: Secondary | ICD-10-CM | POA: Insufficient documentation

## 2021-07-30 DIAGNOSIS — I219 Acute myocardial infarction, unspecified: Secondary | ICD-10-CM | POA: Insufficient documentation

## 2021-07-30 DIAGNOSIS — M4800 Spinal stenosis, site unspecified: Secondary | ICD-10-CM | POA: Insufficient documentation

## 2021-08-04 ENCOUNTER — Encounter: Payer: Self-pay | Admitting: Cardiology

## 2021-08-04 ENCOUNTER — Ambulatory Visit: Payer: BC Managed Care – PPO | Admitting: Cardiology

## 2021-08-04 ENCOUNTER — Other Ambulatory Visit: Payer: Self-pay

## 2021-08-04 VITALS — BP 130/80 | HR 71 | Ht 76.0 in | Wt 196.0 lb

## 2021-08-04 DIAGNOSIS — F17200 Nicotine dependence, unspecified, uncomplicated: Secondary | ICD-10-CM

## 2021-08-04 DIAGNOSIS — I2510 Atherosclerotic heart disease of native coronary artery without angina pectoris: Secondary | ICD-10-CM

## 2021-08-04 DIAGNOSIS — I209 Angina pectoris, unspecified: Secondary | ICD-10-CM | POA: Diagnosis not present

## 2021-08-04 DIAGNOSIS — I25119 Atherosclerotic heart disease of native coronary artery with unspecified angina pectoris: Secondary | ICD-10-CM | POA: Diagnosis not present

## 2021-08-04 DIAGNOSIS — E782 Mixed hyperlipidemia: Secondary | ICD-10-CM

## 2021-08-04 HISTORY — DX: Atherosclerotic heart disease of native coronary artery without angina pectoris: I25.10

## 2021-08-04 MED ORDER — CLOPIDOGREL BISULFATE 75 MG PO TABS: 75.0000 mg | ORAL_TABLET | Freq: Every day | ORAL | 3 refills | Status: DC
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
## 2021-08-04 NOTE — Patient Instructions (Signed)
Medication Instructions:  Your physician has recommended you make the following change in your medication:   Start Plavix 75 mg daily.  *If you need a refill on your cardiac medications before your next appointment, please call your pharmacy*   Lab Work: Your physician recommends that you have a BMET and CBC today in the office for your upcoming procedure.  If you have labs (blood work) drawn today and your tests are completely normal, you will receive your results only by: MyChart Message (if you have MyChart) OR A paper copy in the mail If you have any lab test that is abnormal or we need to change your treatment, we will call you to review the results.   Testing/Procedures:  [facility] CARDIOVASCULAR DIVISION [facility] [place] [address] [place] [address] Dept: [phone] Loc: [phone]  Charles Nielsen  08/04/2021  You are scheduled for a Cardiac Catheterization on Monday, September 12 with Dr. Verdis Prime.  1. Please arrive at the [facility] (Main Entrance A) at [facility]: [address] [place], [place] [address] at 8:30 AM (This time is two hours before your procedure to ensure your preparation). Free valet parking service is available.   Special note: Every effort is made to have your procedure done on time. Please understand that emergencies sometimes delay scheduled procedures.  2. Diet: Do not eat solid foods after midnight.  The patient may have clear liquids until 5am upon the day of the procedure.  3. Labs: You had your labs done in the office.  4. Medication instructions in preparation for your procedure:   Contrast Allergy: No  On the morning of your procedure, take your Aspirin and Plavix/Clopidogrel and any morning medicines NOT listed above.  You may use sips of water.  5. Plan for one night stay--bring personal belongings. 6. Bring a current list of your medications and current insurance  cards. 7. You MUST have a responsible person to drive you home. 8. Someone MUST be with you the first 24 hours after you arrive home or your discharge will be delayed. 9. Please wear clothes that are easy to get on and off and wear slip-on shoes.  Thank you for allowing [place] to care for you!   -- [facility]    Follow-Up: At [facility], you and your health needs are our priority.  As part of our continuing mission to provide you with exceptional heart care, we have created designated Provider Care Teams.  These Care Teams include your primary Cardiologist (physician) and Advanced Practice Providers (APPs -  Physician Assistants and Nurse Practitioners) who all work together to provide you with the care you need, when you need it.  We recommend signing up for the patient portal called "MyChart".  Sign up information is provided on this After Visit Summary.  MyChart is used to connect with patients for Virtual Visits (Telemedicine).  Patients are able to view lab/test results, encounter notes, upcoming appointments, etc.  Non-urgent messages can be sent to your provider as well.   To learn more about what you can do with MyChart, go to [email or web address].    Your next appointment:   3 month(s)  The format for your next appointment:   In Person  Provider:   Belva Crome, MD   Other Instructions  Coronary Angiogram With Stent Coronary angiogram with stent placement is a procedure to widen or open a narrow blood vessel of the heart (coronary  artery). Arteries may become blocked by cholesterol buildup (plaques) in the lining of the artery wall. When a coronary artery becomes partially blocked, blood flow to that area decreases. This may lead to chest pain or a heart attack (myocardial infarction). A stent is a small piece of metal that looks like mesh or spring. Stent placement may be done as treatment after a heart attack, or to prevent a heart  attack if a blocked artery is found by a coronary angiogram. Let your health care provider know about: Any allergies you have, including allergies to medicines or contrast dye. All medicines you are taking, including vitamins, herbs, eye drops, creams, and over-the-counter medicines. Any problems you or family members have had with anesthetic medicines. Any blood disorders you have. Any surgeries you have had. Any medical conditions you have, including kidney problems or kidney failure. Whether you are pregnant or may be pregnant. Whether you are breastfeeding. What are the risks? Generally, this is a safe procedure. However, serious problems may occur, including: Damage to nearby structures or organs, such as the heart, blood vessels, or kidneys. A return of blockage. Bleeding, infection, or bruising at the insertion site. A collection of blood under the skin (hematoma) at the insertion site. A blood clot in another part of the body. Allergic reaction to medicines or dyes. Bleeding into the abdomen (retroperitoneal bleeding). Stroke (rare). Heart attack (rare). What happens before the procedure? Staying hydrated Follow instructions from your health care provider about hydration, which may include: Up to 2 hours before the procedure - you may continue to drink clear liquids, such as water, clear fruit juice, black coffee, and plain tea.    Eating and drinking restrictions Follow instructions from your health care provider about eating and drinking, which may include: 8 hours before the procedure - stop eating heavy meals or foods, such as meat, fried foods, or fatty foods. 6 hours before the procedure - stop eating light meals or foods, such as toast or cereal. 2 hours before the procedure - stop drinking clear liquids. Medicines Ask your health care provider about: Changing or stopping your regular medicines. This is especially important if you are taking diabetes medicines or blood  thinners. Taking medicines such as aspirin and ibuprofen. These medicines can thin your blood. Do not take these medicines unless your health care provider tells you to take them. Generally, aspirin is recommended before a thin tube, called a catheter, is passed through a blood vessel and inserted into the heart (cardiac catheterization). Taking over-the-counter medicines, vitamins, herbs, and supplements. General instructions Do not use any products that contain nicotine or tobacco for at least 4 weeks before the procedure. These products include cigarettes, e-cigarettes, and chewing tobacco. If you need help quitting, ask your health care provider. Plan to have someone take you home from the hospital or clinic. If you will be going home right after the procedure, plan to have someone with you for 24 hours. You may have tests and imaging procedures. Ask your health care provider: How your insertion site will be marked. Ask which artery will be used for the procedure. What steps will be taken to help prevent infection. These may include: Removing hair at the insertion site. Washing skin with a germ-killing soap. Taking antibiotic medicine. What happens during the procedure? An IV will be inserted into one of your veins. Electrodes may be placed on your chest to monitor your heart rate during the procedure. You will be given one or more of  the following: A medicine to help you relax (sedative). A medicine to numb the area (local anesthetic) for catheter insertion. A small incision will be made for catheter insertion. The catheter will be inserted into an artery using a guide wire. The location may be in your groin, your wrist, or the fold of your arm (near your elbow). An X-ray procedure (fluoroscopy) will be used to help guide the catheter to the opening of the heart arteries. A dye will be injected into the catheter. X-rays will be taken. The dye helps to show where any narrowing or  blockages are located in the arteries. Tell your health care provider if you have chest pain or trouble breathing. A tiny wire will be guided to the blocked spot, and a balloon will be inflated to make the artery wider. The stent will be expanded to crush the plaques into the wall of the vessel. The stent will hold the area open and improve the blood flow. Most stents have a drug coating to reduce the risk of the stent narrowing over time. The artery may be made wider using a drill, laser, or other tools that remove plaques. The catheter will be removed when the blood flow improves. The stent will stay where it was placed, and the lining of the artery will grow over it. A bandage (dressing) will be placed on the insertion site. Pressure will be applied to stop bleeding. The IV will be removed. This procedure may vary among health care providers and hospitals.    What happens after the procedure? Your blood pressure, heart rate, breathing rate, and blood oxygen level will be monitored until you leave the hospital or clinic. If the procedure is done through the leg, you will lie flat in bed for a few hours or for as long as told by your health care provider. You will be instructed not to bend or cross your legs. The insertion site and the pulse in your foot or wrist will be checked often. You may have more blood tests, X-rays, and a test that records the electrical activity of your heart (electrocardiogram, or ECG). Do not drive for 24 hours if you were given a sedative during your procedure. Summary Coronary angiogram with stent placement is a procedure to widen or open a narrowed coronary artery. This is done to treat heart problems. Before the procedure, let your health care provider know about all the medical conditions and surgeries you have or have had. This is a safe procedure. However, some problems may occur, including damage to nearby structures or organs, bleeding, blood clots, or  allergies. Follow your health care provider's instructions about eating, drinking, medicines, and other lifestyle changes, such as quitting tobacco use before the procedure. This information is not intended to replace advice given to you by your health care provider. Make sure you discuss any questions you have with your health care provider. Document Revised: 06/06/2019 Document Reviewed: 06/06/2019 Elsevier Patient Education  2021 [employer].

## 2021-08-05 LAB — CBC WITH DIFFERENTIAL/PLATELET
Basophils Absolute: 0.1 10*3/uL (ref 0.0–0.2)
Basos: 1 %
EOS (ABSOLUTE): 0.5 10*3/uL — ABNORMAL HIGH (ref 0.0–0.4)
Eos: 5 %
Hematocrit: 43.8 % (ref 37.5–51.0)
Hemoglobin: 15.2 g/dL (ref 13.0–17.7)
Immature Grans (Abs): 0 10*3/uL (ref 0.0–0.1)
Immature Granulocytes: 0 %
Lymphocytes Absolute: 2.7 10*3/uL (ref 0.7–3.1)
Lymphs: 29 %
MCH: 30.6 pg (ref 26.6–33.0)
MCHC: 34.7 g/dL (ref 31.5–35.7)
MCV: 88 fL (ref 79–97)
Monocytes Absolute: 0.8 10*3/uL (ref 0.1–0.9)
Monocytes: 9 %
Neutrophils Absolute: 5.4 10*3/uL (ref 1.4–7.0)
Neutrophils: 56 %
Platelets: 264 10*3/uL (ref 150–450)
RBC: 4.96 x10E6/uL (ref 4.14–5.80)
RDW: 12.4 % (ref 11.6–15.4)
WBC: 9.5 10*3/uL (ref 3.4–10.8)

## 2021-08-05 LAB — BASIC METABOLIC PANEL
BUN/Creatinine Ratio: 13 (ref 10–24)
BUN: 11 mg/dL (ref 8–27)
CO2: 21 mmol/L (ref 20–29)
Calcium: 9.2 mg/dL (ref 8.6–10.2)
Chloride: 102 mmol/L (ref 96–106)
Creatinine, Ser: 0.86 mg/dL (ref 0.76–1.27)
Glucose: 82 mg/dL (ref 65–99)
Potassium: 4.5 mmol/L (ref 3.5–5.2)
Sodium: 137 mmol/L (ref 134–144)
eGFR: 99 mL/min/{1.73_m2} (ref >59)

## 2021-08-06 ENCOUNTER — Telehealth: Payer: Self-pay | Admitting: Cardiology

## 2021-08-06 ENCOUNTER — Telehealth: Payer: Self-pay | Admitting: *Deleted

## 2021-08-06 NOTE — Telephone Encounter (Signed)
Patient was returning a call from yesterday to discuss lab results

## 2021-08-06 NOTE — Telephone Encounter (Signed)
Cardiac catheterization scheduled at [facility] for: Monday August 10, 2021 10:30 AM [facility] Main Entrance A [facility]) at: 8:30 AM   No solid food after midnight prior to cath, clear liquids until 5 AM day of procedure.   Morning medications can be taken pre-cath with sips of water including: - aspirin 81 mg -Plavix 75 mg      Confirmed patient has responsible adult to drive home post procedure and be with patient first 24 hours after arriving home.  Patients are allowed one visitor in the waiting room during the time they are at the hospital for their procedure. Both patient and visitor must wear a mask once they enter the hospital.   Patient reports does not currently have any symptoms concerning for COVID-19 and no household members with COVID-19 like illness.   Reviewed procedure/mask/visitor instructions with patient.

## 2021-08-10 ENCOUNTER — Encounter (HOSPITAL_COMMUNITY)
Admission: RE | Disposition: A | Discharge status: Home / Self Care | Payer: Self-pay | Source: Home / Self Care | Attending: Interventional Cardiology

## 2021-08-10 ENCOUNTER — Other Ambulatory Visit: Payer: Self-pay

## 2021-08-10 ENCOUNTER — Ambulatory Visit (HOSPITAL_COMMUNITY)
Admission: RE | Admit: 2021-08-10 | Discharge: 2021-08-10 | Disposition: A | Discharge status: Home / Self Care | Payer: BC Managed Care – PPO | Attending: Interventional Cardiology | Admitting: Interventional Cardiology

## 2021-08-10 ENCOUNTER — Ambulatory Visit: Payer: Self-pay | Admitting: Cardiology

## 2021-08-10 ENCOUNTER — Encounter (HOSPITAL_COMMUNITY): Payer: Self-pay | Admitting: Interventional Cardiology

## 2021-08-10 DIAGNOSIS — I2510 Atherosclerotic heart disease of native coronary artery without angina pectoris: Secondary | ICD-10-CM | POA: Diagnosis not present

## 2021-08-10 DIAGNOSIS — I25119 Atherosclerotic heart disease of native coronary artery with unspecified angina pectoris: Secondary | ICD-10-CM | POA: Diagnosis not present

## 2021-08-10 DIAGNOSIS — F17210 Nicotine dependence, cigarettes, uncomplicated: Secondary | ICD-10-CM | POA: Diagnosis not present

## 2021-08-10 DIAGNOSIS — Z880 Allergy status to penicillin: Secondary | ICD-10-CM | POA: Diagnosis not present

## 2021-08-10 DIAGNOSIS — Z79899 Other long term (current) drug therapy: Secondary | ICD-10-CM | POA: Insufficient documentation

## 2021-08-10 DIAGNOSIS — Z885 Allergy status to narcotic agent status: Secondary | ICD-10-CM | POA: Diagnosis not present

## 2021-08-10 DIAGNOSIS — E785 Hyperlipidemia, unspecified: Secondary | ICD-10-CM | POA: Diagnosis present

## 2021-08-10 DIAGNOSIS — R9439 Abnormal result of other cardiovascular function study: Secondary | ICD-10-CM | POA: Diagnosis present

## 2021-08-10 DIAGNOSIS — F17200 Nicotine dependence, unspecified, uncomplicated: Secondary | ICD-10-CM

## 2021-08-10 DIAGNOSIS — E782 Mixed hyperlipidemia: Secondary | ICD-10-CM | POA: Insufficient documentation

## 2021-08-10 DIAGNOSIS — I209 Angina pectoris, unspecified: Secondary | ICD-10-CM

## 2021-08-10 HISTORY — PX: LEFT HEART CATH AND CORONARY ANGIOGRAPHY: CATH118249

## 2021-08-10 SURGERY — LEFT HEART CATH AND CORONARY ANGIOGRAPHY
Anesthesia: LOCAL

## 2021-08-10 MED ORDER — SODIUM CHLORIDE 0.9% FLUSH: 3.0000 mL | INTRAVENOUS | Status: DC | PRN

## 2021-08-10 MED ORDER — VERAPAMIL HCL 2.5 MG/ML IV SOLN
INTRAVENOUS | Status: DC | PRN
  Administered 2021-08-10: 10 mL via INTRA_ARTERIAL

## 2021-08-10 MED ORDER — SODIUM CHLORIDE 0.9 % IV SOLN: 250.0000 mL | INTRAVENOUS | Status: DC | PRN

## 2021-08-10 MED ORDER — HEPARIN (PORCINE) IN NACL 1000-0.9 UT/500ML-% IV SOLN
INTRAVENOUS | Status: AC
  Filled 2021-08-10: qty 1000

## 2021-08-10 MED ORDER — MIDAZOLAM HCL 2 MG/2ML IJ SOLN
INTRAMUSCULAR | Status: AC
  Filled 2021-08-10: qty 2

## 2021-08-10 MED ORDER — ASPIRIN 81 MG PO CHEW: 81.0000 mg | CHEWABLE_TABLET | ORAL | Status: DC

## 2021-08-10 MED ORDER — SODIUM CHLORIDE 0.9% FLUSH: 3.0000 mL | Freq: Two times a day (BID) | INTRAVENOUS | Status: DC

## 2021-08-10 MED ORDER — LABETALOL HCL 5 MG/ML IV SOLN: 10.0000 mg | INTRAVENOUS | Status: DC | PRN

## 2021-08-10 MED ORDER — LIDOCAINE HCL (PF) 1 % IJ SOLN
INTRAMUSCULAR | Status: DC | PRN
  Administered 2021-08-10: 2 mL

## 2021-08-10 MED ORDER — FENTANYL CITRATE (PF) 100 MCG/2ML IJ SOLN
INTRAMUSCULAR | Status: AC
  Filled 2021-08-10: qty 2

## 2021-08-10 MED ORDER — ACETAMINOPHEN 325 MG PO TABS
650.0000 mg | ORAL_TABLET | ORAL | Status: DC | PRN
Start: 2021-08-10 — End: 2021-08-10

## 2021-08-10 MED ORDER — IOHEXOL 350 MG/ML SOLN
INTRAVENOUS | Status: DC | PRN
  Administered 2021-08-10: 50 mL

## 2021-08-10 MED ORDER — HEPARIN SODIUM (PORCINE) 1000 UNIT/ML IJ SOLN
INTRAMUSCULAR | Status: DC | PRN
  Administered 2021-08-10: 4500 [IU] via INTRAVENOUS

## 2021-08-10 MED ORDER — SODIUM CHLORIDE 0.9 % WEIGHT BASED INFUSION
3.0000 mL/kg/h | INTRAVENOUS | Status: AC
  Administered 2021-08-10: 3 mL/kg/h via INTRAVENOUS

## 2021-08-10 MED ORDER — SODIUM CHLORIDE 0.9 % IV SOLN: INTRAVENOUS | Status: DC

## 2021-08-10 MED ORDER — LIDOCAINE HCL (PF) 1 % IJ SOLN
INTRAMUSCULAR | Status: AC
  Filled 2021-08-10: qty 30

## 2021-08-10 MED ORDER — SODIUM CHLORIDE 0.9 % WEIGHT BASED INFUSION: 1.0000 mL/kg/h | INTRAVENOUS | Status: DC

## 2021-08-10 MED ORDER — FENTANYL CITRATE (PF) 100 MCG/2ML IJ SOLN
INTRAMUSCULAR | Status: DC | PRN
  Administered 2021-08-10: 50 ug via INTRAVENOUS

## 2021-08-10 MED ORDER — HYDRALAZINE HCL 20 MG/ML IJ SOLN: 10.0000 mg | INTRAMUSCULAR | Status: DC | PRN

## 2021-08-10 MED ORDER — VERAPAMIL HCL 2.5 MG/ML IV SOLN
INTRAVENOUS | Status: AC
  Filled 2021-08-10: qty 2

## 2021-08-10 MED ORDER — HEPARIN SODIUM (PORCINE) 1000 UNIT/ML IJ SOLN
INTRAMUSCULAR | Status: AC
  Filled 2021-08-10: qty 1

## 2021-08-10 MED ORDER — ONDANSETRON HCL 4 MG/2ML IJ SOLN: 4.0000 mg | Freq: Four times a day (QID) | INTRAMUSCULAR | Status: DC | PRN

## 2021-08-10 MED ORDER — HEPARIN (PORCINE) IN NACL 1000-0.9 UT/500ML-% IV SOLN
INTRAVENOUS | Status: DC | PRN
  Administered 2021-08-10 (×2): 500 mL

## 2021-08-10 MED ORDER — MIDAZOLAM HCL 2 MG/2ML IJ SOLN
INTRAMUSCULAR | Status: DC | PRN
  Administered 2021-08-10: 1 mg via INTRAVENOUS

## 2021-08-10 SURGICAL SUPPLY — 11 items
CATH 5FR JL3.5 JR4 ANG PIG MP (CATHETERS) ×2 IMPLANT
DEVICE RAD COMP TR BAND LRG (VASCULAR PRODUCTS) ×2 IMPLANT
GLIDESHEATH SLEND A-KIT 6F 22G (SHEATH) ×2 IMPLANT
GUIDEWIRE INQWIRE 1.5J.035X260 (WIRE) ×1 IMPLANT
INQWIRE 1.5J .035X260CM (WIRE) ×2
KIT HEART LEFT (KITS) ×2 IMPLANT
PACK CARDIAC CATHETERIZATION (CUSTOM PROCEDURE TRAY) ×2 IMPLANT
SHEATH PROBE COVER 6X72 (BAG) ×2 IMPLANT
TRANSDUCER W/STOPCOCK (MISCELLANEOUS) ×2 IMPLANT
TUBING ART PRESS 72  MALE/MALE (TUBING) ×2 IMPLANT
TUBING CIL FLEX 10 FLL-RA (TUBING) ×2 IMPLANT

## 2021-08-10 NOTE — Discharge Instructions (Signed)

## 2021-08-10 NOTE — Interval H&P Note (Signed)
Cath Lab Visit (complete for each Cath Lab visit)  Clinical Evaluation Leading to the Procedure:   ACS: No.  Non-ACS:    Anginal Classification: CCS III  Anti-ischemic medical therapy: Minimal Therapy (1 class of medications)  Non-Invasive Test Results: No non-invasive testing performed  Prior CABG: No previous CABG      History and Physical Interval Note:  08/10/2021 9:08 AM  Charles Nielsen  has presented today for surgery, with the diagnosis of anigna.  The various methods of treatment have been discussed with the patient and family. After consideration of risks, benefits and other options for treatment, the patient has consented to  Procedure(s): LEFT HEART CATH AND CORONARY ANGIOGRAPHY (N/A) as a surgical intervention.  The patient's history has been reviewed, patient examined, no change in status, stable for surgery.  I have reviewed the patient's chart and labs.  Questions were answered to the patient's satisfaction.     Lyn Records III

## 2021-08-10 NOTE — CV Procedure (Signed)
Widely patent coronary arteries. Mild anteroapical hypokinesis. EF 50%

## 2021-08-10 NOTE — Progress Notes (Signed)
Patient and daughter was given discharge instructions. Both verbalized understanding. 

## 2021-08-20 DIAGNOSIS — R42 Dizziness and giddiness: Secondary | ICD-10-CM | POA: Diagnosis not present

## 2021-08-20 DIAGNOSIS — R531 Weakness: Secondary | ICD-10-CM | POA: Diagnosis not present

## 2021-08-20 DIAGNOSIS — Z6824 Body mass index (BMI) 24.0-24.9, adult: Secondary | ICD-10-CM | POA: Diagnosis not present

## 2021-08-21 ENCOUNTER — Ambulatory Visit: Payer: BC Managed Care – PPO | Admitting: Cardiology

## 2021-08-21 ENCOUNTER — Encounter: Payer: Self-pay | Admitting: Cardiology

## 2021-08-21 ENCOUNTER — Other Ambulatory Visit: Payer: Self-pay

## 2021-08-21 VITALS — BP 128/70 | HR 66 | Ht 76.0 in | Wt 193.8 lb

## 2021-08-21 DIAGNOSIS — E782 Mixed hyperlipidemia: Secondary | ICD-10-CM

## 2021-08-21 DIAGNOSIS — I2510 Atherosclerotic heart disease of native coronary artery without angina pectoris: Secondary | ICD-10-CM

## 2021-08-21 DIAGNOSIS — F17200 Nicotine dependence, unspecified, uncomplicated: Secondary | ICD-10-CM

## 2021-08-21 NOTE — Patient Instructions (Signed)
Medication Instructions:  No medication changes. *If you need a refill on your cardiac medications before your next appointment, please call your pharmacy*   Lab Work: Your physician recommends that you return for lab work in: the next few days. You need to have labs done when you are fasting.  You can come Monday through Friday 8:30 am to 12:00 pm and 1:15 to 4:30. You do not need to make an appointment as the order has already been placed. The labs you are going to have done are BMET, LFT and Lipids.  If you have labs (blood work) drawn today and your tests are completely normal, you will receive your results only by: MyChart Message (if you have MyChart) OR A paper copy in the mail If you have any lab test that is abnormal or we need to change your treatment, we will call you to review the results.   Testing/Procedures: None ordered   Follow-Up: At [facility], you and your health needs are our priority.  As part of our continuing mission to provide you with exceptional heart care, we have created designated Provider Care Teams.  These Care Teams include your primary Cardiologist (physician) and Advanced Practice Providers (APPs -  Physician Assistants and Nurse Practitioners) who all work together to provide you with the care you need, when you need it.  We recommend signing up for the patient portal called "MyChart".  Sign up information is provided on this After Visit Summary.  MyChart is used to connect with patients for Virtual Visits (Telemedicine).  Patients are able to view lab/test results, encounter notes, upcoming appointments, etc.  Non-urgent messages can be sent to your provider as well.   To learn more about what you can do with MyChart, go to [email or web address].    Your next appointment:   12 month(s)  The format for your next appointment:   In Person  Provider:   Belva Crome, MD   Other Instructions NA
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
## 2021-08-21 NOTE — Addendum Note (Signed)
Addended by: Eleonore Chiquito on: 08/21/2021 03:37 PM   Modules accepted: Orders

## 2021-08-28 DIAGNOSIS — M2550 Pain in unspecified joint: Secondary | ICD-10-CM | POA: Diagnosis not present

## 2021-08-28 DIAGNOSIS — E871 Hypo-osmolality and hyponatremia: Secondary | ICD-10-CM | POA: Diagnosis not present

## 2021-11-24 ENCOUNTER — Ambulatory Visit: Payer: BC Managed Care – PPO | Admitting: Cardiology

## 2022-01-08 DIAGNOSIS — Z20828 Contact with and (suspected) exposure to other viral communicable diseases: Secondary | ICD-10-CM | POA: Diagnosis not present

## 2022-01-08 DIAGNOSIS — J321 Chronic frontal sinusitis: Secondary | ICD-10-CM | POA: Diagnosis not present

## 2022-02-01 DIAGNOSIS — R61 Generalized hyperhidrosis: Secondary | ICD-10-CM | POA: Diagnosis not present

## 2022-02-01 DIAGNOSIS — M25551 Pain in right hip: Secondary | ICD-10-CM | POA: Diagnosis not present

## 2022-02-01 DIAGNOSIS — M25521 Pain in right elbow: Secondary | ICD-10-CM | POA: Diagnosis not present

## 2022-02-01 DIAGNOSIS — M2550 Pain in unspecified joint: Secondary | ICD-10-CM | POA: Diagnosis not present

## 2022-02-01 DIAGNOSIS — R531 Weakness: Secondary | ICD-10-CM | POA: Diagnosis not present

## 2022-02-01 DIAGNOSIS — E871 Hypo-osmolality and hyponatremia: Secondary | ICD-10-CM | POA: Diagnosis not present

## 2022-02-01 DIAGNOSIS — W19XXXA Unspecified fall, initial encounter: Secondary | ICD-10-CM | POA: Diagnosis not present

## 2022-05-03 DIAGNOSIS — G44209 Tension-type headache, unspecified, not intractable: Secondary | ICD-10-CM | POA: Diagnosis not present

## 2022-05-03 DIAGNOSIS — F17210 Nicotine dependence, cigarettes, uncomplicated: Secondary | ICD-10-CM | POA: Diagnosis not present

## 2022-05-03 DIAGNOSIS — I119 Hypertensive heart disease without heart failure: Secondary | ICD-10-CM | POA: Diagnosis not present

## 2022-05-03 DIAGNOSIS — F411 Generalized anxiety disorder: Secondary | ICD-10-CM | POA: Diagnosis not present

## 2022-05-05 ENCOUNTER — Other Ambulatory Visit: Payer: Self-pay | Admitting: Cardiology

## 2022-05-05 NOTE — Telephone Encounter (Signed)
RX sent

## 2022-08-04 DIAGNOSIS — Z7902 Long term (current) use of antithrombotics/antiplatelets: Secondary | ICD-10-CM | POA: Diagnosis not present

## 2022-08-04 DIAGNOSIS — G8929 Other chronic pain: Secondary | ICD-10-CM | POA: Diagnosis not present

## 2022-08-04 DIAGNOSIS — F17210 Nicotine dependence, cigarettes, uncomplicated: Secondary | ICD-10-CM | POA: Diagnosis not present

## 2022-08-04 DIAGNOSIS — I4891 Unspecified atrial fibrillation: Secondary | ICD-10-CM | POA: Diagnosis not present

## 2022-08-04 DIAGNOSIS — F1010 Alcohol abuse, uncomplicated: Secondary | ICD-10-CM | POA: Diagnosis not present

## 2022-08-04 DIAGNOSIS — I482 Chronic atrial fibrillation, unspecified: Secondary | ICD-10-CM | POA: Diagnosis not present

## 2022-08-04 DIAGNOSIS — R079 Chest pain, unspecified: Secondary | ICD-10-CM | POA: Diagnosis not present

## 2022-08-04 DIAGNOSIS — Z79899 Other long term (current) drug therapy: Secondary | ICD-10-CM | POA: Diagnosis not present

## 2022-08-04 DIAGNOSIS — I252 Old myocardial infarction: Secondary | ICD-10-CM | POA: Diagnosis not present

## 2022-08-04 DIAGNOSIS — I10 Essential (primary) hypertension: Secondary | ICD-10-CM | POA: Diagnosis not present

## 2022-08-04 DIAGNOSIS — R918 Other nonspecific abnormal finding of lung field: Secondary | ICD-10-CM | POA: Diagnosis not present

## 2022-08-05 DIAGNOSIS — F17210 Nicotine dependence, cigarettes, uncomplicated: Secondary | ICD-10-CM | POA: Diagnosis not present

## 2022-08-05 DIAGNOSIS — F17200 Nicotine dependence, unspecified, uncomplicated: Secondary | ICD-10-CM | POA: Diagnosis not present

## 2022-08-05 DIAGNOSIS — R079 Chest pain, unspecified: Secondary | ICD-10-CM | POA: Diagnosis not present

## 2022-08-05 DIAGNOSIS — I4819 Other persistent atrial fibrillation: Secondary | ICD-10-CM | POA: Diagnosis not present

## 2022-08-05 DIAGNOSIS — I482 Chronic atrial fibrillation, unspecified: Secondary | ICD-10-CM | POA: Diagnosis not present

## 2022-08-05 DIAGNOSIS — I2510 Atherosclerotic heart disease of native coronary artery without angina pectoris: Secondary | ICD-10-CM | POA: Diagnosis not present

## 2022-08-30 ENCOUNTER — Other Ambulatory Visit: Payer: Self-pay

## 2022-09-03 ENCOUNTER — Ambulatory Visit: Payer: BC Managed Care – PPO | Attending: Cardiology | Admitting: Cardiology

## 2022-09-03 ENCOUNTER — Encounter: Payer: Self-pay | Admitting: Cardiology

## 2022-09-03 VITALS — BP 120/78 | HR 70 | Ht 76.0 in | Wt 201.0 lb

## 2022-09-03 DIAGNOSIS — I480 Paroxysmal atrial fibrillation: Secondary | ICD-10-CM | POA: Diagnosis not present

## 2022-09-03 DIAGNOSIS — I2510 Atherosclerotic heart disease of native coronary artery without angina pectoris: Secondary | ICD-10-CM | POA: Diagnosis not present

## 2022-09-03 DIAGNOSIS — E782 Mixed hyperlipidemia: Secondary | ICD-10-CM

## 2022-09-03 DIAGNOSIS — F17200 Nicotine dependence, unspecified, uncomplicated: Secondary | ICD-10-CM

## 2022-09-03 HISTORY — DX: Paroxysmal atrial fibrillation: I48.0

## 2022-09-03 HISTORY — DX: Mixed hyperlipidemia: E78.2

## 2022-09-03 MED ORDER — METOPROLOL SUCCINATE ER 50 MG PO TB24: 50.0000 mg | ORAL_TABLET | Freq: Every day | ORAL | 3 refills | Status: DC

## 2022-09-03 MED ORDER — ISOSORBIDE MONONITRATE ER 30 MG PO TB24: 30.0000 mg | ORAL_TABLET | Freq: Every day | ORAL | 3 refills | Status: DC

## 2022-09-03 MED ORDER — ELIQUIS 5 MG PO TABS: 5.0000 mg | ORAL_TABLET | Freq: Two times a day (BID) | ORAL | 3 refills | Status: DC

## 2022-09-03 MED ORDER — ATORVASTATIN CALCIUM 20 MG PO TABS: 20.0000 mg | ORAL_TABLET | Freq: Every day | ORAL | 3 refills | Status: DC
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
## 2022-09-03 NOTE — Patient Instructions (Signed)
Medication Instructions:  Your physician recommends that you continue on your current medications as directed. Please refer to the Current Medication list given to you today.  *If you need a refill on your cardiac medications before your next appointment, please call your pharmacy*   Lab Work: Your physician recommends that you have a BMET and CBC today in the office.  If you have labs (blood work) drawn today and your tests are completely normal, you will receive your results only by: [place] (if you have MyChart) OR A paper copy in the mail If you have any lab test that is abnormal or we need to change your treatment, we will call you to review the results.   Testing/Procedures: Your physician has recommended that you have a Cardioversion (DCCV). Electrical Cardioversion uses a jolt of electricity to your heart either through paddles or wired patches attached to your chest. This is a controlled, usually prescheduled, procedure. Defibrillation is done under light anesthesia in the hospital, and you usually go home the day of the procedure. This is done to get your heart back into a normal rhythm. You are not awake for the procedure.   Diagnosis: atrial fibrillation  DATE AND TIME OF PROCEDURE: 09/09/22 Time TBD  Please register at the Admitting Department at: 09/09/22 [facility] will call you on 09/08/22 with arrival time.  DO NOT EAT OR DRINK ANYTHING after midnight prior to your procedure.  You should take your medications as usual with a sip of water.   DO NOT STOP or miss any doses of your  Eliquis blood thinners that you are taking.  If you miss a dose of this medication let us know as soon as possible as we will need to reschedule your procedure. Missing a dose of the blood thinner and continuing with the cardioversion places you at greater risk for having a stroke.  Have your blood drawn as intructed.  You will need someone with you to drive you home after the  procedure.    Follow-Up: At [facility], you and your health needs are our priority.  As part of our continuing mission to provide you with exceptional heart care, we have created designated Provider Care Teams.  These Care Teams include your primary Cardiologist (physician) and Advanced Practice Providers (APPs -  Physician Assistants and Nurse Practitioners) who all work together to provide you with the care you need, when you need it.  We recommend signing up for the patient portal called "MyChart".  Sign up information is provided on this After Visit Summary.  MyChart is used to connect with patients for Virtual Visits (Telemedicine).  Patients are able to view lab/test results, encounter notes, upcoming appointments, etc.  Non-urgent messages can be sent to your provider as well.   To learn more about what you can do with MyChart, go to [email or web address].    Your next appointment:   1 month(s)  The format for your next appointment:   In Person  Provider:   Jyl Heinz, MD   Other Instructions  Electrical Cardioversion Electrical cardioversion is the delivery of a jolt of electricity to restore a normal rhythm to the heart. A rhythm that is too fast or is not regular keeps the heart from pumping well. In this procedure, sticky patches or metal paddles are placed on the chest to deliver electricity to the heart from a device. This procedure may be done in an emergency if: There is low or no blood pressure as a result  of the heart rhythm. Normal rhythm must be restored as fast as possible to protect the brain and heart from further damage. It may save a life. This may also be a scheduled procedure for irregular or fast heart rhythms that are not immediately life-threatening. Tell a health care provider about: Any allergies you have. All medicines you are taking, including vitamins, herbs, eye drops, creams, and over-the-counter medicines. Any problems you or family  members have had with anesthetic medicines. Any blood disorders you have. Any surgeries you have had. Any medical conditions you have. Whether you are pregnant or may be pregnant. What are the risks? Generally, this is a safe procedure. However, problems may occur, including: Allergic reactions to medicines. A blood clot that breaks free and travels to other parts of your body. The possible return of an abnormal heart rhythm within hours or days after the procedure. Your heart stopping (cardiac arrest). This is rare. What happens before the procedure? Medicines Your health care provider may have you start taking: Blood-thinning medicines (anticoagulants) so your blood does not clot as easily. Medicines to help stabilize your heart rate and rhythm. Ask your health care provider about: Changing or stopping your regular medicines. This is especially important if you are taking diabetes medicines or blood thinners. Taking medicines such as aspirin and ibuprofen. These medicines can thin your blood. Do not take these medicines unless your health care provider tells you to take them. Taking over-the-counter medicines, vitamins, herbs, and supplements. General instructions Follow instructions from your health care provider about eating or drinking restrictions. Plan to have someone take you home from the hospital or clinic. If you will be going home right after the procedure, plan to have someone with you for 24 hours. Ask your health care provider what steps will be taken to help prevent infection. These may include washing your skin with a germ-killing soap. What happens during the procedure? An IV will be inserted into one of your veins. Sticky patches (electrodes) or metal paddles may be placed on your chest. You will be given a medicine to help you relax (sedative). An electrical shock will be delivered. The procedure may vary among health care providers and hospitals.    What can I  expect after the procedure? Your blood pressure, heart rate, breathing rate, and blood oxygen level will be monitored until you leave the hospital or clinic. Your heart rhythm will be watched to make sure it does not change. You may have some redness on the skin where the shocks were given. Follow these instructions at home: Do not drive for 24 hours if you were given a sedative during your procedure. Take over-the-counter and prescription medicines only as told by your health care provider. Ask your health care provider how to check your pulse. Check it often. Rest for 48 hours after the procedure or as told by your health care provider. Avoid or limit your caffeine use as told by your health care provider. Keep all follow-up visits as told by your health care provider. This is important. Contact a health care provider if: You feel like your heart is beating too quickly or your pulse is not regular. You have a serious muscle cramp that does not go away. Get help right away if: You have discomfort in your chest. You are dizzy or you feel faint. You have trouble breathing or you are short of breath. Your speech is slurred. You have trouble moving an arm or leg on one side of  your body. Your fingers or toes turn cold or blue. Summary Electrical cardioversion is the delivery of a jolt of electricity to restore a normal rhythm to the heart. This procedure may be done right away in an emergency or may be a scheduled procedure if the condition is not an emergency. Generally, this is a safe procedure. After the procedure, check your pulse often as told by your health care provider. This information is not intended to replace advice given to you by your health care provider. Make sure you discuss any questions you have with your health care provider. Document Revised: 06/18/2019 Document Reviewed: 06/18/2019 Elsevier Patient Education  2021 [employer].

## 2022-09-03 NOTE — Addendum Note (Signed)
Addended by: Gidget Quizhpi, Jonelle Sidle L on: 09/03/2022 04:15 PM   Modules accepted: Orders

## 2022-09-04 LAB — CBC
Hematocrit: 44.5 % (ref 37.5–51.0)
Hemoglobin: 15 g/dL (ref 13.0–17.7)
MCH: 31.1 pg (ref 26.6–33.0)
MCHC: 33.7 g/dL (ref 31.5–35.7)
MCV: 92 fL (ref 79–97)
Platelets: 224 10*3/uL (ref 150–450)
RBC: 4.82 x10E6/uL (ref 4.14–5.80)
RDW: 12.8 % (ref 11.6–15.4)
WBC: 7.9 10*3/uL (ref 3.4–10.8)

## 2022-09-04 LAB — BASIC METABOLIC PANEL
BUN/Creatinine Ratio: 11 (ref 10–24)
BUN: 13 mg/dL (ref 8–27)
CO2: 23 mmol/L (ref 20–29)
Calcium: 9.5 mg/dL (ref 8.6–10.2)
Chloride: 101 mmol/L (ref 96–106)
Creatinine, Ser: 1.21 mg/dL (ref 0.76–1.27)
Glucose: 81 mg/dL (ref 70–99)
Potassium: 4.8 mmol/L (ref 3.5–5.2)
Sodium: 137 mmol/L (ref 134–144)
eGFR: 68 mL/min/{1.73_m2} (ref >59)

## 2022-09-06 ENCOUNTER — Telehealth: Payer: Self-pay | Admitting: Cardiology

## 2022-09-06 MED ORDER — BUPROPION HCL ER (XL) 150 MG PO TB24: 150.0000 mg | ORAL_TABLET | Freq: Every day | ORAL | 0 refills | Status: AC

## 2022-09-06 NOTE — Telephone Encounter (Signed)
Pt c/o medication issue:  1. Name of Medication:   buPROPion (WELLBUTRIN XL) 150 MG 24 hr tablet    2. How are you currently taking this medication (dosage and times per day)?   3. Are you having a reaction (difficulty breathing--STAT)?   4. What is your medication issue? Pt roommate calling because she states pt has has been anxious and wants to know if pt can start on a new anxiety medication other than this one

## 2022-09-06 NOTE — Telephone Encounter (Signed)
Wellbutrin called in for 14 days per VO Dr. Geraldo Pitter.

## 2022-09-06 NOTE — Telephone Encounter (Signed)
Greta Doom states she is returning a call back.

## 2022-09-09 DIAGNOSIS — E785 Hyperlipidemia, unspecified: Secondary | ICD-10-CM | POA: Diagnosis not present

## 2022-09-09 DIAGNOSIS — I4891 Unspecified atrial fibrillation: Secondary | ICD-10-CM | POA: Diagnosis not present

## 2022-09-09 DIAGNOSIS — I2510 Atherosclerotic heart disease of native coronary artery without angina pectoris: Secondary | ICD-10-CM | POA: Diagnosis not present

## 2022-09-09 DIAGNOSIS — I480 Paroxysmal atrial fibrillation: Secondary | ICD-10-CM | POA: Diagnosis not present

## 2022-09-09 DIAGNOSIS — F17210 Nicotine dependence, cigarettes, uncomplicated: Secondary | ICD-10-CM | POA: Diagnosis not present

## 2022-09-13 DIAGNOSIS — F419 Anxiety disorder, unspecified: Secondary | ICD-10-CM | POA: Diagnosis not present

## 2022-09-13 DIAGNOSIS — E7800 Pure hypercholesterolemia, unspecified: Secondary | ICD-10-CM | POA: Diagnosis not present

## 2022-09-13 DIAGNOSIS — I10 Essential (primary) hypertension: Secondary | ICD-10-CM | POA: Diagnosis not present

## 2022-09-13 DIAGNOSIS — R5383 Other fatigue: Secondary | ICD-10-CM | POA: Diagnosis not present

## 2022-09-19 ENCOUNTER — Other Ambulatory Visit: Payer: Self-pay | Admitting: Cardiology

## 2022-10-06 ENCOUNTER — Other Ambulatory Visit: Payer: Self-pay

## 2022-10-08 ENCOUNTER — Ambulatory Visit: Payer: BC Managed Care – PPO | Attending: Cardiology | Admitting: Cardiology

## 2022-10-08 ENCOUNTER — Encounter: Payer: Self-pay | Admitting: Cardiology

## 2022-10-08 VITALS — BP 120/64 | HR 68 | Ht 76.0 in | Wt 202.6 lb

## 2022-10-08 DIAGNOSIS — I480 Paroxysmal atrial fibrillation: Secondary | ICD-10-CM | POA: Diagnosis not present

## 2022-10-08 DIAGNOSIS — I2510 Atherosclerotic heart disease of native coronary artery without angina pectoris: Secondary | ICD-10-CM | POA: Diagnosis not present

## 2022-10-08 DIAGNOSIS — F17210 Nicotine dependence, cigarettes, uncomplicated: Secondary | ICD-10-CM | POA: Diagnosis not present

## 2022-10-08 DIAGNOSIS — E782 Mixed hyperlipidemia: Secondary | ICD-10-CM | POA: Diagnosis not present

## 2022-10-08 DIAGNOSIS — F17200 Nicotine dependence, unspecified, uncomplicated: Secondary | ICD-10-CM

## 2022-10-08 NOTE — Patient Instructions (Addendum)
Medication Instructions:  Your physician recommends that you continue on your current medications as directed. Please refer to the Current Medication list given to you today.  *If you need a refill on your cardiac medications before your next appointment, please call your pharmacy*   Lab Work: None Ordered If you have labs (blood work) drawn today and your tests are completely normal, you will receive your results only by: MyChart Message (if you have MyChart) OR A paper copy in the mail If you have any lab test that is abnormal or we need to change your treatment, we will call you to review the results.   Testing/Procedures: We will order CT coronary calcium score. It will cost $99.00 and is not covered by insurance.  Please call to schedule.       Follow-Up: At [facility], you and your health needs are our priority.  As part of our continuing mission to provide you with exceptional heart care, we have created designated Provider Care Teams.  These Care Teams include your primary Cardiologist (physician) and Advanced Practice Providers (APPs -  Physician Assistants and Nurse Practitioners) who all work together to provide you with the care you need, when you need it.  We recommend signing up for the patient portal called "MyChart".  Sign up information is provided on this After Visit Summary.  MyChart is used to connect with patients for Virtual Visits (Telemedicine).  Patients are able to view lab/test results, encounter notes, upcoming appointments, etc.  Non-urgent messages can be sent to your provider as well.   To learn more about what you can do with MyChart, go to [email or web address].    Your next appointment:   6 month(s)  The format for your next appointment:   In Person  Provider:   Belva Crome, MD    Other Instructions NA
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
## 2022-11-08 DIAGNOSIS — F419 Anxiety disorder, unspecified: Secondary | ICD-10-CM | POA: Diagnosis not present

## 2022-11-08 DIAGNOSIS — J069 Acute upper respiratory infection, unspecified: Secondary | ICD-10-CM | POA: Diagnosis not present

## 2022-11-08 DIAGNOSIS — E7800 Pure hypercholesterolemia, unspecified: Secondary | ICD-10-CM | POA: Diagnosis not present

## 2022-11-08 DIAGNOSIS — I10 Essential (primary) hypertension: Secondary | ICD-10-CM | POA: Diagnosis not present

## 2022-11-09 DIAGNOSIS — R197 Diarrhea, unspecified: Secondary | ICD-10-CM | POA: Diagnosis not present

## 2022-11-09 DIAGNOSIS — Z885 Allergy status to narcotic agent status: Secondary | ICD-10-CM | POA: Diagnosis not present

## 2022-11-09 DIAGNOSIS — W19XXXA Unspecified fall, initial encounter: Secondary | ICD-10-CM | POA: Diagnosis not present

## 2022-11-09 DIAGNOSIS — F1090 Alcohol use, unspecified, uncomplicated: Secondary | ICD-10-CM | POA: Diagnosis not present

## 2022-11-09 DIAGNOSIS — J439 Emphysema, unspecified: Secondary | ICD-10-CM | POA: Diagnosis not present

## 2022-11-09 DIAGNOSIS — M2578 Osteophyte, vertebrae: Secondary | ICD-10-CM | POA: Diagnosis not present

## 2022-11-09 DIAGNOSIS — E860 Dehydration: Secondary | ICD-10-CM | POA: Diagnosis not present

## 2022-11-09 DIAGNOSIS — J320 Chronic maxillary sinusitis: Secondary | ICD-10-CM | POA: Diagnosis not present

## 2022-11-09 DIAGNOSIS — S42352A Displaced comminuted fracture of shaft of humerus, left arm, initial encounter for closed fracture: Secondary | ICD-10-CM | POA: Diagnosis not present

## 2022-11-09 DIAGNOSIS — M47812 Spondylosis without myelopathy or radiculopathy, cervical region: Secondary | ICD-10-CM | POA: Diagnosis not present

## 2022-11-09 DIAGNOSIS — Z87442 Personal history of urinary calculi: Secondary | ICD-10-CM | POA: Diagnosis not present

## 2022-11-09 DIAGNOSIS — I4819 Other persistent atrial fibrillation: Secondary | ICD-10-CM | POA: Diagnosis not present

## 2022-11-09 DIAGNOSIS — S4292XA Fracture of left shoulder girdle, part unspecified, initial encounter for closed fracture: Secondary | ICD-10-CM | POA: Diagnosis not present

## 2022-11-09 DIAGNOSIS — Z7901 Long term (current) use of anticoagulants: Secondary | ICD-10-CM | POA: Diagnosis not present

## 2022-11-09 DIAGNOSIS — E871 Hypo-osmolality and hyponatremia: Secondary | ICD-10-CM | POA: Diagnosis not present

## 2022-11-09 DIAGNOSIS — I2510 Atherosclerotic heart disease of native coronary artery without angina pectoris: Secondary | ICD-10-CM | POA: Diagnosis not present

## 2022-11-09 DIAGNOSIS — M25512 Pain in left shoulder: Secondary | ICD-10-CM | POA: Diagnosis not present

## 2022-11-09 DIAGNOSIS — S42202A Unspecified fracture of upper end of left humerus, initial encounter for closed fracture: Secondary | ICD-10-CM | POA: Diagnosis not present

## 2022-11-09 DIAGNOSIS — I252 Old myocardial infarction: Secondary | ICD-10-CM | POA: Diagnosis not present

## 2022-11-09 DIAGNOSIS — M25519 Pain in unspecified shoulder: Secondary | ICD-10-CM | POA: Diagnosis not present

## 2022-11-09 DIAGNOSIS — F32A Depression, unspecified: Secondary | ICD-10-CM | POA: Diagnosis not present

## 2022-11-09 DIAGNOSIS — I4891 Unspecified atrial fibrillation: Secondary | ICD-10-CM | POA: Diagnosis not present

## 2022-11-09 DIAGNOSIS — J9811 Atelectasis: Secondary | ICD-10-CM | POA: Diagnosis not present

## 2022-11-09 DIAGNOSIS — U071 COVID-19: Secondary | ICD-10-CM | POA: Diagnosis not present

## 2022-11-09 DIAGNOSIS — Z79899 Other long term (current) drug therapy: Secondary | ICD-10-CM | POA: Diagnosis not present

## 2022-11-09 DIAGNOSIS — Z880 Allergy status to penicillin: Secondary | ICD-10-CM | POA: Diagnosis not present

## 2022-11-09 DIAGNOSIS — S42212A Unspecified displaced fracture of surgical neck of left humerus, initial encounter for closed fracture: Secondary | ICD-10-CM | POA: Diagnosis not present

## 2022-11-09 DIAGNOSIS — L930 Discoid lupus erythematosus: Secondary | ICD-10-CM | POA: Diagnosis not present

## 2022-11-09 DIAGNOSIS — I672 Cerebral atherosclerosis: Secondary | ICD-10-CM | POA: Diagnosis not present

## 2022-11-09 DIAGNOSIS — E861 Hypovolemia: Secondary | ICD-10-CM | POA: Diagnosis not present

## 2022-11-09 DIAGNOSIS — F17210 Nicotine dependence, cigarettes, uncomplicated: Secondary | ICD-10-CM | POA: Diagnosis not present

## 2022-11-09 DIAGNOSIS — R55 Syncope and collapse: Secondary | ICD-10-CM | POA: Diagnosis not present

## 2022-11-10 DIAGNOSIS — R55 Syncope and collapse: Secondary | ICD-10-CM | POA: Diagnosis not present

## 2022-11-18 ENCOUNTER — Telehealth: Payer: Self-pay | Admitting: Cardiology

## 2022-11-18 DIAGNOSIS — S42213A Unspecified displaced fracture of surgical neck of unspecified humerus, initial encounter for closed fracture: Secondary | ICD-10-CM | POA: Diagnosis not present

## 2022-11-18 NOTE — Telephone Encounter (Signed)
   Pre-operative Risk Assessment    Patient Name: Charles Nielsen  DOB: [birth year] MRN: [id number]      Request for Surgical Clearance    Procedure:   ORIF LEFT PROXIMAL HUMERUS   Date of Surgery:  Clearance 11/23/22                                 Surgeon:  Dr. Loralie Champagne  Surgeon's Group or Practice Name: [facility]  Phone number:  [phone] Fax number:  [phone]   Type of Clearance Requested:   - Medical  - Pharmacy:  Hold Apixaban (Eliquis)     Type of Anesthesia:  General    Additional requests/questions:  Please advise surgeon/provider what medications should be held.  Signed, April Henson   11/18/2022, 1:39 PM

## 2022-11-18 NOTE — Telephone Encounter (Signed)
Patient with diagnosis of afib on Eliquis for anticoagulation.    Procedure: ORIF LEFT PROXIMAL HUMERUS  Date of procedure: 11/23/22  CHA2DS2-VASc Score = 2  This indicates a 2.2% annual risk of stroke. The patient's score is based upon: CHF History: 0 HTN History: 1 Diabetes History: 0 Stroke History: 0 Vascular Disease History: 1 Age Score: 0 Gender Score: 0   CrCl 60mL/min Platelet count 224K  Per office protocol, patient can hold Eliquis for 3 days prior to procedure.    **This guidance is not considered finalized until pre-operative APP has relayed final recommendations.**

## 2022-11-18 NOTE — Telephone Encounter (Signed)
     Primary Cardiologist: Garwin Brothers, MD  Chart reviewed as part of pre-operative protocol coverage. Given past medical history and time since last visit, based on ACC/AHA guidelines, Charles Nielsen would be at acceptable risk for the planned procedure without further cardiovascular testing.    Patient with diagnosis of afib on Eliquis for anticoagulation.     Procedure: ORIF LEFT PROXIMAL HUMERUS  Date of procedure: 11/23/22   CHA2DS2-VASc Score = 2  This indicates a 2.2% annual risk of stroke. The patient's score is based upon: CHF History: 0 HTN History: 1 Diabetes History: 0 Stroke History: 0 Vascular Disease History: 1 Age Score: 0 Gender Score: 0   CrCl 73mL/min Platelet count 224K   Per office protocol, patient can hold Eliquis for 3 days prior to procedure.    I will route this recommendation to the requesting party via Epic fax function and remove from pre-op pool.  Please call with questions.  Thomasene Ripple. Ian Castagna NP-C     11/18/2022, 3:43 PM [facility] [address] Office [phone] Fax [phone]

## 2022-11-19 DIAGNOSIS — E7800 Pure hypercholesterolemia, unspecified: Secondary | ICD-10-CM | POA: Diagnosis not present

## 2022-11-19 DIAGNOSIS — F419 Anxiety disorder, unspecified: Secondary | ICD-10-CM | POA: Diagnosis not present

## 2022-11-19 DIAGNOSIS — S4292XA Fracture of left shoulder girdle, part unspecified, initial encounter for closed fracture: Secondary | ICD-10-CM | POA: Diagnosis not present

## 2022-11-19 DIAGNOSIS — I10 Essential (primary) hypertension: Secondary | ICD-10-CM | POA: Diagnosis not present

## 2022-11-23 DIAGNOSIS — F17210 Nicotine dependence, cigarettes, uncomplicated: Secondary | ICD-10-CM | POA: Diagnosis not present

## 2022-11-23 DIAGNOSIS — Z7901 Long term (current) use of anticoagulants: Secondary | ICD-10-CM | POA: Diagnosis not present

## 2022-11-23 DIAGNOSIS — G8918 Other acute postprocedural pain: Secondary | ICD-10-CM | POA: Diagnosis not present

## 2022-11-23 DIAGNOSIS — M329 Systemic lupus erythematosus, unspecified: Secondary | ICD-10-CM | POA: Diagnosis not present

## 2022-11-23 DIAGNOSIS — Y92012 Bathroom of single-family (private) house as the place of occurrence of the external cause: Secondary | ICD-10-CM | POA: Diagnosis not present

## 2022-11-23 DIAGNOSIS — I252 Old myocardial infarction: Secondary | ICD-10-CM | POA: Diagnosis not present

## 2022-11-23 DIAGNOSIS — S42202A Unspecified fracture of upper end of left humerus, initial encounter for closed fracture: Secondary | ICD-10-CM | POA: Diagnosis not present

## 2022-11-23 DIAGNOSIS — I10 Essential (primary) hypertension: Secondary | ICD-10-CM | POA: Diagnosis not present

## 2022-11-23 DIAGNOSIS — M810 Age-related osteoporosis without current pathological fracture: Secondary | ICD-10-CM | POA: Diagnosis not present

## 2022-11-23 DIAGNOSIS — R2689 Other abnormalities of gait and mobility: Secondary | ICD-10-CM | POA: Diagnosis not present

## 2022-11-23 DIAGNOSIS — S42252A Displaced fracture of greater tuberosity of left humerus, initial encounter for closed fracture: Secondary | ICD-10-CM | POA: Diagnosis not present

## 2022-11-23 DIAGNOSIS — S42222A 2-part displaced fracture of surgical neck of left humerus, initial encounter for closed fracture: Secondary | ICD-10-CM | POA: Diagnosis not present

## 2022-11-23 DIAGNOSIS — W1839XA Other fall on same level, initial encounter: Secondary | ICD-10-CM | POA: Diagnosis not present

## 2022-12-10 DIAGNOSIS — M25412 Effusion, left shoulder: Secondary | ICD-10-CM | POA: Diagnosis not present

## 2022-12-10 DIAGNOSIS — M25612 Stiffness of left shoulder, not elsewhere classified: Secondary | ICD-10-CM | POA: Diagnosis not present

## 2022-12-10 DIAGNOSIS — M25512 Pain in left shoulder: Secondary | ICD-10-CM | POA: Diagnosis not present

## 2022-12-15 DIAGNOSIS — M25412 Effusion, left shoulder: Secondary | ICD-10-CM | POA: Diagnosis not present

## 2022-12-15 DIAGNOSIS — M25512 Pain in left shoulder: Secondary | ICD-10-CM | POA: Diagnosis not present

## 2022-12-15 DIAGNOSIS — M25612 Stiffness of left shoulder, not elsewhere classified: Secondary | ICD-10-CM | POA: Diagnosis not present

## 2022-12-21 DIAGNOSIS — M25412 Effusion, left shoulder: Secondary | ICD-10-CM | POA: Diagnosis not present

## 2022-12-21 DIAGNOSIS — M25512 Pain in left shoulder: Secondary | ICD-10-CM | POA: Diagnosis not present

## 2022-12-21 DIAGNOSIS — M25612 Stiffness of left shoulder, not elsewhere classified: Secondary | ICD-10-CM | POA: Diagnosis not present

## 2022-12-28 DIAGNOSIS — M25512 Pain in left shoulder: Secondary | ICD-10-CM | POA: Diagnosis not present

## 2022-12-28 DIAGNOSIS — S42202A Unspecified fracture of upper end of left humerus, initial encounter for closed fracture: Secondary | ICD-10-CM | POA: Diagnosis not present

## 2022-12-28 DIAGNOSIS — M25612 Stiffness of left shoulder, not elsewhere classified: Secondary | ICD-10-CM | POA: Diagnosis not present

## 2022-12-28 DIAGNOSIS — M25412 Effusion, left shoulder: Secondary | ICD-10-CM | POA: Diagnosis not present

## 2023-01-11 DIAGNOSIS — M25612 Stiffness of left shoulder, not elsewhere classified: Secondary | ICD-10-CM | POA: Diagnosis not present

## 2023-01-11 DIAGNOSIS — M25512 Pain in left shoulder: Secondary | ICD-10-CM | POA: Diagnosis not present

## 2023-01-11 DIAGNOSIS — M25412 Effusion, left shoulder: Secondary | ICD-10-CM | POA: Diagnosis not present

## 2023-01-18 DIAGNOSIS — S42202A Unspecified fracture of upper end of left humerus, initial encounter for closed fracture: Secondary | ICD-10-CM | POA: Diagnosis not present

## 2023-01-19 DIAGNOSIS — M25612 Stiffness of left shoulder, not elsewhere classified: Secondary | ICD-10-CM | POA: Diagnosis not present

## 2023-01-19 DIAGNOSIS — M25412 Effusion, left shoulder: Secondary | ICD-10-CM | POA: Diagnosis not present

## 2023-01-19 DIAGNOSIS — M25512 Pain in left shoulder: Secondary | ICD-10-CM | POA: Diagnosis not present

## 2023-01-20 DIAGNOSIS — I10 Essential (primary) hypertension: Secondary | ICD-10-CM | POA: Diagnosis not present

## 2023-01-20 DIAGNOSIS — F419 Anxiety disorder, unspecified: Secondary | ICD-10-CM | POA: Diagnosis not present

## 2023-01-20 DIAGNOSIS — I2510 Atherosclerotic heart disease of native coronary artery without angina pectoris: Secondary | ICD-10-CM | POA: Diagnosis not present

## 2023-01-20 DIAGNOSIS — F5104 Psychophysiologic insomnia: Secondary | ICD-10-CM | POA: Diagnosis not present

## 2023-01-25 DIAGNOSIS — M25512 Pain in left shoulder: Secondary | ICD-10-CM | POA: Diagnosis not present

## 2023-01-25 DIAGNOSIS — M25412 Effusion, left shoulder: Secondary | ICD-10-CM | POA: Diagnosis not present

## 2023-01-25 DIAGNOSIS — M25612 Stiffness of left shoulder, not elsewhere classified: Secondary | ICD-10-CM | POA: Diagnosis not present

## 2023-02-01 DIAGNOSIS — M25512 Pain in left shoulder: Secondary | ICD-10-CM | POA: Diagnosis not present

## 2023-02-01 DIAGNOSIS — M25412 Effusion, left shoulder: Secondary | ICD-10-CM | POA: Diagnosis not present

## 2023-02-01 DIAGNOSIS — M25612 Stiffness of left shoulder, not elsewhere classified: Secondary | ICD-10-CM | POA: Diagnosis not present

## 2023-02-08 DIAGNOSIS — M25412 Effusion, left shoulder: Secondary | ICD-10-CM | POA: Diagnosis not present

## 2023-02-08 DIAGNOSIS — S42212K Unspecified displaced fracture of surgical neck of left humerus, subsequent encounter for fracture with nonunion: Secondary | ICD-10-CM | POA: Diagnosis not present

## 2023-02-08 DIAGNOSIS — M25512 Pain in left shoulder: Secondary | ICD-10-CM | POA: Diagnosis not present

## 2023-02-08 DIAGNOSIS — M25612 Stiffness of left shoulder, not elsewhere classified: Secondary | ICD-10-CM | POA: Diagnosis not present

## 2023-02-15 DIAGNOSIS — M25512 Pain in left shoulder: Secondary | ICD-10-CM | POA: Diagnosis not present

## 2023-02-15 DIAGNOSIS — M25412 Effusion, left shoulder: Secondary | ICD-10-CM | POA: Diagnosis not present

## 2023-02-15 DIAGNOSIS — M25612 Stiffness of left shoulder, not elsewhere classified: Secondary | ICD-10-CM | POA: Diagnosis not present

## 2023-03-01 DIAGNOSIS — M25412 Effusion, left shoulder: Secondary | ICD-10-CM | POA: Diagnosis not present

## 2023-03-01 DIAGNOSIS — M25612 Stiffness of left shoulder, not elsewhere classified: Secondary | ICD-10-CM | POA: Diagnosis not present

## 2023-03-01 DIAGNOSIS — M25512 Pain in left shoulder: Secondary | ICD-10-CM | POA: Diagnosis not present

## 2023-03-03 DIAGNOSIS — E7800 Pure hypercholesterolemia, unspecified: Secondary | ICD-10-CM | POA: Diagnosis not present

## 2023-03-03 DIAGNOSIS — F5104 Psychophysiologic insomnia: Secondary | ICD-10-CM | POA: Diagnosis not present

## 2023-03-03 DIAGNOSIS — I2510 Atherosclerotic heart disease of native coronary artery without angina pectoris: Secondary | ICD-10-CM | POA: Diagnosis not present

## 2023-03-03 DIAGNOSIS — I10 Essential (primary) hypertension: Secondary | ICD-10-CM | POA: Diagnosis not present

## 2023-03-03 DIAGNOSIS — F419 Anxiety disorder, unspecified: Secondary | ICD-10-CM | POA: Diagnosis not present

## 2023-03-07 DIAGNOSIS — M25512 Pain in left shoulder: Secondary | ICD-10-CM | POA: Diagnosis not present

## 2023-03-07 DIAGNOSIS — M25412 Effusion, left shoulder: Secondary | ICD-10-CM | POA: Diagnosis not present

## 2023-03-07 DIAGNOSIS — M25612 Stiffness of left shoulder, not elsewhere classified: Secondary | ICD-10-CM | POA: Diagnosis not present

## 2023-03-08 DIAGNOSIS — S42202D Unspecified fracture of upper end of left humerus, subsequent encounter for fracture with routine healing: Secondary | ICD-10-CM | POA: Diagnosis not present

## 2023-03-16 DIAGNOSIS — M25612 Stiffness of left shoulder, not elsewhere classified: Secondary | ICD-10-CM | POA: Diagnosis not present

## 2023-03-16 DIAGNOSIS — M25412 Effusion, left shoulder: Secondary | ICD-10-CM | POA: Diagnosis not present

## 2023-03-16 DIAGNOSIS — M25512 Pain in left shoulder: Secondary | ICD-10-CM | POA: Diagnosis not present

## 2023-03-17 DIAGNOSIS — F5104 Psychophysiologic insomnia: Secondary | ICD-10-CM | POA: Diagnosis not present

## 2023-03-17 DIAGNOSIS — I10 Essential (primary) hypertension: Secondary | ICD-10-CM | POA: Diagnosis not present

## 2023-03-17 DIAGNOSIS — I2510 Atherosclerotic heart disease of native coronary artery without angina pectoris: Secondary | ICD-10-CM | POA: Diagnosis not present

## 2023-03-17 DIAGNOSIS — I4891 Unspecified atrial fibrillation: Secondary | ICD-10-CM | POA: Diagnosis not present

## 2023-03-22 DIAGNOSIS — S42213A Unspecified displaced fracture of surgical neck of unspecified humerus, initial encounter for closed fracture: Secondary | ICD-10-CM | POA: Diagnosis not present

## 2023-03-22 DIAGNOSIS — S42202D Unspecified fracture of upper end of left humerus, subsequent encounter for fracture with routine healing: Secondary | ICD-10-CM | POA: Diagnosis not present

## 2023-03-23 DIAGNOSIS — M25512 Pain in left shoulder: Secondary | ICD-10-CM | POA: Diagnosis not present

## 2023-03-23 DIAGNOSIS — M25612 Stiffness of left shoulder, not elsewhere classified: Secondary | ICD-10-CM | POA: Diagnosis not present

## 2023-03-23 DIAGNOSIS — M25412 Effusion, left shoulder: Secondary | ICD-10-CM | POA: Diagnosis not present

## 2023-03-30 DIAGNOSIS — M25612 Stiffness of left shoulder, not elsewhere classified: Secondary | ICD-10-CM | POA: Diagnosis not present

## 2023-03-30 DIAGNOSIS — M25512 Pain in left shoulder: Secondary | ICD-10-CM | POA: Diagnosis not present

## 2023-03-30 DIAGNOSIS — M25412 Effusion, left shoulder: Secondary | ICD-10-CM | POA: Diagnosis not present

## 2023-04-06 DIAGNOSIS — M25612 Stiffness of left shoulder, not elsewhere classified: Secondary | ICD-10-CM | POA: Diagnosis not present

## 2023-04-06 DIAGNOSIS — M25512 Pain in left shoulder: Secondary | ICD-10-CM | POA: Diagnosis not present

## 2023-04-06 DIAGNOSIS — M25412 Effusion, left shoulder: Secondary | ICD-10-CM | POA: Diagnosis not present

## 2023-04-18 DIAGNOSIS — M25612 Stiffness of left shoulder, not elsewhere classified: Secondary | ICD-10-CM | POA: Diagnosis not present

## 2023-04-18 DIAGNOSIS — M25412 Effusion, left shoulder: Secondary | ICD-10-CM | POA: Diagnosis not present

## 2023-04-18 DIAGNOSIS — M25512 Pain in left shoulder: Secondary | ICD-10-CM | POA: Diagnosis not present

## 2023-04-26 DIAGNOSIS — M25512 Pain in left shoulder: Secondary | ICD-10-CM | POA: Diagnosis not present

## 2023-04-26 DIAGNOSIS — M25412 Effusion, left shoulder: Secondary | ICD-10-CM | POA: Diagnosis not present

## 2023-04-26 DIAGNOSIS — M25612 Stiffness of left shoulder, not elsewhere classified: Secondary | ICD-10-CM | POA: Diagnosis not present

## 2023-05-03 DIAGNOSIS — M25412 Effusion, left shoulder: Secondary | ICD-10-CM | POA: Diagnosis not present

## 2023-05-03 DIAGNOSIS — M25512 Pain in left shoulder: Secondary | ICD-10-CM | POA: Diagnosis not present

## 2023-05-03 DIAGNOSIS — M25612 Stiffness of left shoulder, not elsewhere classified: Secondary | ICD-10-CM | POA: Diagnosis not present

## 2023-05-09 DIAGNOSIS — M25512 Pain in left shoulder: Secondary | ICD-10-CM | POA: Diagnosis not present

## 2023-05-09 DIAGNOSIS — M25612 Stiffness of left shoulder, not elsewhere classified: Secondary | ICD-10-CM | POA: Diagnosis not present

## 2023-05-09 DIAGNOSIS — M25412 Effusion, left shoulder: Secondary | ICD-10-CM | POA: Diagnosis not present

## 2023-05-16 DIAGNOSIS — I10 Essential (primary) hypertension: Secondary | ICD-10-CM | POA: Diagnosis not present

## 2023-05-16 DIAGNOSIS — I4891 Unspecified atrial fibrillation: Secondary | ICD-10-CM | POA: Diagnosis not present

## 2023-05-16 DIAGNOSIS — F5104 Psychophysiologic insomnia: Secondary | ICD-10-CM | POA: Diagnosis not present

## 2023-05-16 DIAGNOSIS — I2510 Atherosclerotic heart disease of native coronary artery without angina pectoris: Secondary | ICD-10-CM | POA: Diagnosis not present

## 2023-05-24 DIAGNOSIS — S42202K Unspecified fracture of upper end of left humerus, subsequent encounter for fracture with nonunion: Secondary | ICD-10-CM | POA: Diagnosis not present

## 2023-05-24 DIAGNOSIS — M25512 Pain in left shoulder: Secondary | ICD-10-CM | POA: Diagnosis not present

## 2023-05-24 DIAGNOSIS — M25612 Stiffness of left shoulder, not elsewhere classified: Secondary | ICD-10-CM | POA: Diagnosis not present

## 2023-05-24 DIAGNOSIS — S42213A Unspecified displaced fracture of surgical neck of unspecified humerus, initial encounter for closed fracture: Secondary | ICD-10-CM | POA: Diagnosis not present

## 2023-05-24 DIAGNOSIS — M25412 Effusion, left shoulder: Secondary | ICD-10-CM | POA: Diagnosis not present

## 2023-05-31 DIAGNOSIS — M25412 Effusion, left shoulder: Secondary | ICD-10-CM | POA: Diagnosis not present

## 2023-05-31 DIAGNOSIS — M25612 Stiffness of left shoulder, not elsewhere classified: Secondary | ICD-10-CM | POA: Diagnosis not present

## 2023-05-31 DIAGNOSIS — M25512 Pain in left shoulder: Secondary | ICD-10-CM | POA: Diagnosis not present

## 2023-06-01 ENCOUNTER — Other Ambulatory Visit: Payer: Self-pay | Admitting: Cardiology

## 2023-07-05 DIAGNOSIS — C44621 Squamous cell carcinoma of skin of unspecified upper limb, including shoulder: Secondary | ICD-10-CM | POA: Diagnosis not present

## 2023-07-05 DIAGNOSIS — L578 Other skin changes due to chronic exposure to nonionizing radiation: Secondary | ICD-10-CM | POA: Diagnosis not present

## 2023-07-05 DIAGNOSIS — L814 Other melanin hyperpigmentation: Secondary | ICD-10-CM | POA: Diagnosis not present

## 2023-07-13 ENCOUNTER — Other Ambulatory Visit: Payer: Self-pay | Admitting: Cardiology

## 2023-07-18 DIAGNOSIS — I4891 Unspecified atrial fibrillation: Secondary | ICD-10-CM | POA: Diagnosis not present

## 2023-07-18 DIAGNOSIS — I2510 Atherosclerotic heart disease of native coronary artery without angina pectoris: Secondary | ICD-10-CM | POA: Diagnosis not present

## 2023-07-18 DIAGNOSIS — I10 Essential (primary) hypertension: Secondary | ICD-10-CM | POA: Diagnosis not present

## 2023-07-18 DIAGNOSIS — F5104 Psychophysiologic insomnia: Secondary | ICD-10-CM | POA: Diagnosis not present

## 2023-07-18 DIAGNOSIS — E7800 Pure hypercholesterolemia, unspecified: Secondary | ICD-10-CM | POA: Diagnosis not present

## 2023-09-06 DIAGNOSIS — L570 Actinic keratosis: Secondary | ICD-10-CM | POA: Diagnosis not present

## 2023-09-15 ENCOUNTER — Other Ambulatory Visit: Payer: Self-pay | Admitting: Cardiology

## 2023-09-19 DIAGNOSIS — F5104 Psychophysiologic insomnia: Secondary | ICD-10-CM | POA: Diagnosis not present

## 2023-09-19 DIAGNOSIS — I10 Essential (primary) hypertension: Secondary | ICD-10-CM | POA: Diagnosis not present

## 2023-09-19 DIAGNOSIS — I4891 Unspecified atrial fibrillation: Secondary | ICD-10-CM | POA: Diagnosis not present

## 2023-09-19 DIAGNOSIS — I2510 Atherosclerotic heart disease of native coronary artery without angina pectoris: Secondary | ICD-10-CM | POA: Diagnosis not present

## 2023-10-05 ENCOUNTER — Encounter: Payer: Self-pay | Admitting: Cardiology

## 2023-10-05 ENCOUNTER — Ambulatory Visit: Payer: BC Managed Care – PPO | Attending: Cardiology | Admitting: Cardiology

## 2023-10-05 VITALS — BP 122/78 | HR 75 | Ht 76.0 in | Wt 197.6 lb

## 2023-10-05 DIAGNOSIS — F17200 Nicotine dependence, unspecified, uncomplicated: Secondary | ICD-10-CM

## 2023-10-05 DIAGNOSIS — I480 Paroxysmal atrial fibrillation: Secondary | ICD-10-CM

## 2023-10-05 DIAGNOSIS — E782 Mixed hyperlipidemia: Secondary | ICD-10-CM

## 2023-10-05 DIAGNOSIS — I2510 Atherosclerotic heart disease of native coronary artery without angina pectoris: Secondary | ICD-10-CM

## 2023-10-05 NOTE — Patient Instructions (Signed)
Medication Instructions:  Your physician recommends that you continue on your current medications as directed. Please refer to the Current Medication list given to you today.  *If you need a refill on your cardiac medications before your next appointment, please call your pharmacy*   Lab Work: Your physician recommends that you have a CMP, CBC, TSH and lipids today in the office.  If you have labs (blood work) drawn today and your tests are completely normal, you will receive your results only by: MyChart Message (if you have MyChart) OR A paper copy in the mail If you have any lab test that is abnormal or we need to change your treatment, we will call you to review the results.   Testing/Procedures: None ordered   Follow-Up: At [facility], you and your health needs are our priority.  As part of our continuing mission to provide you with exceptional heart care, we have created designated Provider Care Teams.  These Care Teams include your primary Cardiologist (physician) and Advanced Practice Providers (APPs -  Physician Assistants and Nurse Practitioners) who all work together to provide you with the care you need, when you need it.  We recommend signing up for the patient portal called "MyChart".  Sign up information is provided on this After Visit Summary.  MyChart is used to connect with patients for Virtual Visits (Telemedicine).  Patients are able to view lab/test results, encounter notes, upcoming appointments, etc.  Non-urgent messages can be sent to your provider as well.   To learn more about what you can do with MyChart, go to [email or web address].    Your next appointment:   9 month(s)  The format for your next appointment:   In Person  Provider:   Belva Crome, MD    Other Instructions none  Important Information About Sugar
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
## 2023-10-06 LAB — COMPREHENSIVE METABOLIC PANEL
ALT: 30 [IU]/L (ref 0–44)
AST: 27 [IU]/L (ref 0–40)
Albumin: 4.1 g/dL (ref 3.9–4.9)
Alkaline Phosphatase: 85 [IU]/L (ref 44–121)
BUN/Creatinine Ratio: 14 (ref 10–24)
BUN: 13 mg/dL (ref 8–27)
Bilirubin Total: 0.4 mg/dL (ref 0.0–1.2)
CO2: 23 mmol/L (ref 20–29)
Calcium: 8.8 mg/dL (ref 8.6–10.2)
Chloride: 104 mmol/L (ref 96–106)
Creatinine, Ser: 0.92 mg/dL (ref 0.76–1.27)
Globulin, Total: 2 g/dL (ref 1.5–4.5)
Glucose: 89 mg/dL (ref 70–99)
Potassium: 4.9 mmol/L (ref 3.5–5.2)
Sodium: 139 mmol/L (ref 134–144)
Total Protein: 6.1 g/dL (ref 6.0–8.5)
eGFR: 93 mL/min/{1.73_m2} (ref >59)

## 2023-10-06 LAB — CBC
Hematocrit: 44.5 % (ref 37.5–51.0)
Hemoglobin: 14.7 g/dL (ref 13.0–17.7)
MCH: 31.5 pg (ref 26.6–33.0)
MCHC: 33 g/dL (ref 31.5–35.7)
MCV: 95 fL (ref 79–97)
Platelets: 237 10*3/uL (ref 150–450)
RBC: 4.67 x10E6/uL (ref 4.14–5.80)
RDW: 12.7 % (ref 11.6–15.4)
WBC: 7.1 10*3/uL (ref 3.4–10.8)

## 2023-10-06 LAB — LIPID PANEL
Chol/HDL Ratio: 1.9 ratio (ref 0.0–5.0)
Cholesterol, Total: 118 mg/dL (ref 100–199)
HDL: 61 mg/dL (ref >39)
LDL Chol Calc (NIH): 46 mg/dL (ref 0–99)
Triglycerides: 44 mg/dL (ref 0–149)
VLDL Cholesterol Cal: 11 mg/dL (ref 5–40)

## 2023-10-06 LAB — TSH: TSH: 1.87 u[IU]/mL (ref 0.450–4.500)

## 2023-10-19 ENCOUNTER — Other Ambulatory Visit: Payer: Self-pay | Admitting: Cardiology

## 2023-11-14 DIAGNOSIS — S42202K Unspecified fracture of upper end of left humerus, subsequent encounter for fracture with nonunion: Secondary | ICD-10-CM | POA: Diagnosis not present

## 2023-11-14 DIAGNOSIS — X58XXXD Exposure to other specified factors, subsequent encounter: Secondary | ICD-10-CM | POA: Diagnosis not present

## 2023-12-14 ENCOUNTER — Other Ambulatory Visit: Payer: Self-pay | Admitting: Cardiology

## 2024-03-29 ENCOUNTER — Other Ambulatory Visit: Payer: Self-pay | Admitting: Cardiology

## 2024-03-29 NOTE — Telephone Encounter (Signed)
 Rx refill sent to pharmacy.

## 2024-07-17 ENCOUNTER — Encounter: Payer: Self-pay | Admitting: Neurology

## 2024-07-17 ENCOUNTER — Telehealth: Payer: Self-pay | Admitting: Neurology

## 2024-07-17 ENCOUNTER — Ambulatory Visit (INDEPENDENT_AMBULATORY_CARE_PROVIDER_SITE_OTHER): Admitting: Neurology

## 2024-07-17 VITALS — BP 154/89 | HR 88 | Resp 15 | Ht 76.0 in | Wt 180.5 lb

## 2024-07-17 DIAGNOSIS — R531 Weakness: Secondary | ICD-10-CM | POA: Diagnosis not present
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
## 2024-07-17 NOTE — Telephone Encounter (Signed)
 Get Medical record from [facility] admission in 2025.

## 2024-07-20 ENCOUNTER — Other Ambulatory Visit: Payer: Self-pay | Admitting: Cardiology

## 2024-07-20 NOTE — Telephone Encounter (Signed)
 Prescription refill request for Eliquis  received. Indication:afib Last office visit:11/24 Scr:0.92  11/24 Age: 63 Weight:81.9  kg  Prescription refilled

## 2024-07-24 ENCOUNTER — Telehealth: Payer: Self-pay | Admitting: Neurology

## 2024-07-24 DIAGNOSIS — R531 Weakness: Secondary | ICD-10-CM

## 2024-07-24 NOTE — Telephone Encounter (Signed)
 CHARON barrows: [id number] exp. 07/24/24-08/22/24 sent to [name] since he has a spinal stimulator. [phone]

## 2024-07-26 NOTE — Telephone Encounter (Signed)
 I was going to have the CT scheduled at [facility], but his insurance wouldn't let me because [facility] is cheaper since it's a free standing imaging facility. I sent the order there and cancelled the MRI order.  Charles Nielsen: [id number] exp. 07/26/24-08/24/24 sent to [facility] [phone]

## 2024-07-26 NOTE — Telephone Encounter (Signed)
 Per [name]: he says he does not have [facility] information , no longer has card and [facility] has not been in use in 10 years, he just never got it removed. Our policy is that we cannot scan him.  They want to know if you want to order a CT scan instead?

## 2024-07-26 NOTE — Addendum Note (Signed)
 Addended by: KATRINKA RICHERD CROME on: 07/26/2024 11:10 AM   Modules accepted: Orders

## 2024-07-26 NOTE — Addendum Note (Signed)
 Addended by: Debra Colon on: 07/26/2024 10:45 AM   Modules accepted: Orders

## 2024-07-26 NOTE — Telephone Encounter (Signed)
 Orders Placed This Encounter  Procedures   CT HEAD WO CONTRAST ( )      Ok to change to CT head

## 2024-08-06 ENCOUNTER — Ambulatory Visit
Admission: RE | Admit: 2024-08-06 | Discharge: 2024-08-06 | Disposition: A | Discharge status: Home / Self Care | Source: Ambulatory Visit | Attending: Neurology | Admitting: Neurology

## 2024-08-06 ENCOUNTER — Other Ambulatory Visit: Payer: Self-pay | Admitting: Cardiology

## 2024-08-06 DIAGNOSIS — R531 Weakness: Secondary | ICD-10-CM

## 2024-08-07 ENCOUNTER — Ambulatory Visit: Payer: Self-pay | Admitting: Neurology

## 2024-08-30 ENCOUNTER — Ambulatory Visit: Admitting: Diagnostic Neuroimaging

## 2024-09-18 ENCOUNTER — Other Ambulatory Visit: Payer: Self-pay | Admitting: Cardiology

## 2024-12-28 ENCOUNTER — Telehealth: Payer: Self-pay | Admitting: Family Medicine

## 2024-12-28 NOTE — Telephone Encounter (Signed)
New patient appointment needs to be rescheduled.

## 2024-12-31 ENCOUNTER — Ambulatory Visit: Admitting: Family Medicine

## 2025-01-22 ENCOUNTER — Ambulatory Visit: Admitting: Family Medicine

## 2025-02-05 ENCOUNTER — Ambulatory Visit: Admitting: Neurology
